# Patient Record
Sex: Male | Born: 1940
Health system: Southern US, Community
[De-identification: ages and names within clinical notes are randomized; demographics above are authoritative.]

## PROBLEM LIST (undated history)

## (undated) DIAGNOSIS — M1990 Unspecified osteoarthritis, unspecified site: Secondary | ICD-10-CM

## (undated) DIAGNOSIS — F329 Major depressive disorder, single episode, unspecified: Secondary | ICD-10-CM

## (undated) DIAGNOSIS — L600 Ingrowing nail: Secondary | ICD-10-CM

## (undated) DIAGNOSIS — E785 Hyperlipidemia, unspecified: Secondary | ICD-10-CM

## (undated) DIAGNOSIS — F32A Depression, unspecified: Secondary | ICD-10-CM

## (undated) HISTORY — PX: HEMORROIDECTOMY: SUR656

## (undated) HISTORY — PX: TONSILLECTOMY: SUR1361

---

## 2007-06-26 ENCOUNTER — Ambulatory Visit: Payer: Self-pay | Admitting: Internal Medicine

## 2007-09-23 DIAGNOSIS — C4491 Basal cell carcinoma of skin, unspecified: Secondary | ICD-10-CM

## 2007-09-23 HISTORY — DX: Basal cell carcinoma of skin, unspecified: C44.91

## 2009-09-30 DIAGNOSIS — D239 Other benign neoplasm of skin, unspecified: Secondary | ICD-10-CM

## 2009-09-30 HISTORY — DX: Other benign neoplasm of skin, unspecified: D23.9

## 2010-07-28 ENCOUNTER — Ambulatory Visit: Payer: Self-pay | Admitting: Unknown Physician Specialty

## 2010-07-29 LAB — PATHOLOGY REPORT

## 2015-01-07 ENCOUNTER — Encounter (INDEPENDENT_AMBULATORY_CARE_PROVIDER_SITE_OTHER): Payer: Medicare PPO | Admitting: Ophthalmology

## 2015-01-07 DIAGNOSIS — H3531 Nonexudative age-related macular degeneration: Secondary | ICD-10-CM

## 2015-01-07 DIAGNOSIS — H35373 Puckering of macula, bilateral: Secondary | ICD-10-CM

## 2015-01-07 DIAGNOSIS — H43813 Vitreous degeneration, bilateral: Secondary | ICD-10-CM

## 2015-07-09 ENCOUNTER — Ambulatory Visit (INDEPENDENT_AMBULATORY_CARE_PROVIDER_SITE_OTHER): Payer: Medicare PPO | Admitting: Ophthalmology

## 2015-07-09 DIAGNOSIS — H43813 Vitreous degeneration, bilateral: Secondary | ICD-10-CM | POA: Diagnosis not present

## 2015-07-09 DIAGNOSIS — H3531 Nonexudative age-related macular degeneration: Secondary | ICD-10-CM

## 2015-07-09 DIAGNOSIS — H35363 Drusen (degenerative) of macula, bilateral: Secondary | ICD-10-CM

## 2015-09-24 ENCOUNTER — Encounter: Payer: Self-pay | Admitting: *Deleted

## 2015-09-27 ENCOUNTER — Ambulatory Visit: Payer: Medicare PPO | Admitting: Certified Registered Nurse Anesthetist

## 2015-09-27 ENCOUNTER — Encounter
Admission: RE | Disposition: A | Discharge status: Home / Self Care | Payer: Self-pay | Source: Ambulatory Visit | Attending: Unknown Physician Specialty

## 2015-09-27 ENCOUNTER — Ambulatory Visit
Admission: RE | Admit: 2015-09-27 | Discharge: 2015-09-27 | Disposition: A | Discharge status: Home / Self Care | Payer: Medicare PPO | Source: Ambulatory Visit | Attending: Unknown Physician Specialty | Admitting: Unknown Physician Specialty

## 2015-09-27 DIAGNOSIS — K640 First degree hemorrhoids: Secondary | ICD-10-CM | POA: Insufficient documentation

## 2015-09-27 DIAGNOSIS — Z79899 Other long term (current) drug therapy: Secondary | ICD-10-CM | POA: Insufficient documentation

## 2015-09-27 DIAGNOSIS — Z86010 Personal history of colonic polyps: Secondary | ICD-10-CM | POA: Insufficient documentation

## 2015-09-27 DIAGNOSIS — K5730 Diverticulosis of large intestine without perforation or abscess without bleeding: Secondary | ICD-10-CM | POA: Diagnosis not present

## 2015-09-27 DIAGNOSIS — E785 Hyperlipidemia, unspecified: Secondary | ICD-10-CM | POA: Diagnosis not present

## 2015-09-27 DIAGNOSIS — M1990 Unspecified osteoarthritis, unspecified site: Secondary | ICD-10-CM | POA: Insufficient documentation

## 2015-09-27 DIAGNOSIS — F329 Major depressive disorder, single episode, unspecified: Secondary | ICD-10-CM | POA: Diagnosis not present

## 2015-09-27 DIAGNOSIS — Z1211 Encounter for screening for malignant neoplasm of colon: Secondary | ICD-10-CM | POA: Diagnosis not present

## 2015-09-27 DIAGNOSIS — Z7982 Long term (current) use of aspirin: Secondary | ICD-10-CM | POA: Diagnosis not present

## 2015-09-27 DIAGNOSIS — Z87891 Personal history of nicotine dependence: Secondary | ICD-10-CM | POA: Diagnosis not present

## 2015-09-27 HISTORY — DX: Ingrowing nail: L60.0

## 2015-09-27 HISTORY — DX: Depression, unspecified: F32.A

## 2015-09-27 HISTORY — DX: Hyperlipidemia, unspecified: E78.5

## 2015-09-27 HISTORY — DX: Major depressive disorder, single episode, unspecified: F32.9

## 2015-09-27 HISTORY — DX: Unspecified osteoarthritis, unspecified site: M19.90

## 2015-09-27 HISTORY — PX: COLONOSCOPY WITH PROPOFOL: SHX5780

## 2015-09-27 SURGERY — COLONOSCOPY WITH PROPOFOL
Anesthesia: General

## 2015-09-27 MED ORDER — SODIUM CHLORIDE 0.9 % IV SOLN
INTRAVENOUS | Status: DC
  Administered 2015-09-27: 1000 mL via INTRAVENOUS

## 2015-09-27 MED ORDER — PROPOFOL 10 MG/ML IV BOLUS
INTRAVENOUS | Status: DC | PRN
  Administered 2015-09-27: 45 mg via INTRAVENOUS

## 2015-09-27 MED ORDER — LIDOCAINE HCL (CARDIAC) 20 MG/ML IV SOLN
INTRAVENOUS | Status: DC | PRN
  Administered 2015-09-27: 67 mg via INTRAVENOUS

## 2015-09-27 MED ORDER — SODIUM CHLORIDE 0.9 % IV SOLN: INTRAVENOUS | Status: DC

## 2015-09-27 MED ORDER — PROPOFOL 500 MG/50ML IV EMUL
INTRAVENOUS | Status: DC | PRN
  Administered 2015-09-27: 120 ug/kg/min via INTRAVENOUS

## 2015-09-27 MED ORDER — FENTANYL CITRATE (PF) 100 MCG/2ML IJ SOLN
INTRAMUSCULAR | Status: DC | PRN
  Administered 2015-09-27: 50 ug via INTRAVENOUS

## 2015-09-27 NOTE — Transfer of Care (Signed)
Immediate Anesthesia Transfer of Care Note  Patient: Dwayne Banks  Procedure(s) Performed: Procedure(s): COLONOSCOPY WITH PROPOFOL (N/A)  Patient Location: PACU  Anesthesia Type:General  Level of Consciousness: awake and patient cooperative  Airway & Oxygen Therapy: Patient Spontanous Breathing and Patient connected to nasal cannula oxygen  Post-op Assessment: Report given to RN and Post -op Vital signs reviewed and stable  Post vital signs: Reviewed and stable  Last Vitals:  Filed Vitals:   09/27/15 1131  BP: 136/87  Pulse: 73  Temp: 36 C  Resp: 14    Complications: No apparent anesthesia complications

## 2015-09-27 NOTE — H&P (Signed)
   Primary Care Physician:  Idelle Crouch, MD Primary Gastroenterologist:  Dr. Vira Agar  Pre-Procedure History & Physical: HPI:  Dwayne Banks is a 74 y.o. male is here for an colonoscopy.   Past Medical History  Diagnosis Date  . Arthritis   . Depression   . Hyperlipidemia   . Onychocryptosis     Past Surgical History  Procedure Laterality Date  . Tonsillectomy    . Hemorroidectomy      Prior to Admission medications   Medication Sig Start Date End Date Taking? Authorizing Provider  aspirin (ASPIRIN EC) 81 MG EC tablet Take 81 mg by mouth daily. Swallow whole.   Yes Historical Provider, MD  citalopram (CELEXA) 20 MG tablet Take 20 mg by mouth daily.   Yes Historical Provider, MD  GLUCOSAMINE SULFATE PO Take 500 mg by mouth daily.   Yes Historical Provider, MD  simvastatin (ZOCOR) 40 MG tablet Take 40 mg by mouth daily.   Yes Historical Provider, MD    Allergies as of 07/27/2015  . (Not on File)    No family history on file.  Social History   Social History  . Marital Status: Married    Spouse Name: N/A  . Number of Children: N/A  . Years of Education: N/A   Occupational History  . Not on file.   Social History Main Topics  . Smoking status: Former Research scientist (life sciences)  . Smokeless tobacco: Never Used  . Alcohol Use: Not on file  . Drug Use: Not on file  . Sexual Activity: Not on file   Other Topics Concern  . Not on file   Social History Narrative  . No narrative on file    Review of Systems: See HPI, otherwise negative ROS  Physical Exam: BP 151/78 mmHg  Pulse 65  Temp(Src) 97.3 F (36.3 C) (Tympanic)  Resp 16  Ht 5\' 8"  (1.727 m)  Wt 70.308 kg (155 lb)  BMI 23.57 kg/m2  SpO2 100% General:   Alert,  pleasant and cooperative in NAD Head:  Normocephalic and atraumatic. Neck:  Supple; no masses or thyromegaly. Lungs:  Clear throughout to auscultation.    Heart:  Regular rate and rhythm. Abdomen:  Soft, nontender and nondistended. Normal bowel sounds,  without guarding, and without rebound.   Neurologic:  Alert and  oriented x4;  grossly normal neurologically.  Impression/Plan: Dwayne Banks is here for an colonoscopy to be performed for [facility] colon polyps  Risks, benefits, limitations, and alternatives regarding  colonoscopy have been reviewed with the patient.  Questions have been answered.  All parties agreeable.   Gaylyn Cheers, MD  09/27/2015, 11:05 AM

## 2015-09-27 NOTE — Op Note (Signed)
[facility] Gastroenterology Patient Name: Dwayne Banks Procedure Date: 09/27/2015 11:06 AM MRN: [id number] Account #: [id number] Date of Birth: 04-Mar-1941 Admit Type: Outpatient Age: 74 Room: [facility] ENDO ROOM 1 Gender: Male Note Status: Finalized Procedure:         Colonoscopy Indications:       High risk colon cancer surveillance: Personal history of                     colonic polyps Providers:         Manya Silvas, MD Referring MD:      Leonie Douglas. Doy Hutching, MD (Referring MD) Medicines:         Propofol per Anesthesia Complications:     No immediate complications. Procedure:         Pre-Anesthesia Assessment:                    - After reviewing the risks and benefits, the patient was                     deemed in satisfactory condition to undergo the procedure.                    After obtaining informed consent, the colonoscope was                     passed under direct vision. Throughout the procedure, the                     patient's blood pressure, pulse, and oxygen saturations                     were monitored continuously. The Colonoscope was                     introduced through the anus and advanced to the the cecum,                     identified by appendiceal orifice and ileocecal valve. The                     colonoscopy was performed without difficulty. The patient                     tolerated the procedure well. The quality of the bowel                     preparation was good. Findings:      Multiple small and large-mouthed diverticula were found in the sigmoid       colon, in the descending colon, in the transverse colon and in the       ascending colon.      Internal hemorrhoids were found during endoscopy. The hemorrhoids were       small and Grade I (internal hemorrhoids that do not prolapse).      The exam was otherwise without abnormality. Impression:        - Diverticulosis in the sigmoid colon, in the descending        colon, in the transverse colon and in the ascending colon.                    - Internal hemorrhoids.                    -  The examination was otherwise normal.                    - No specimens collected. Recommendation:    - The findings and recommendations were discussed with the                     patient's family. Repeat in 5 years or not at all. Manya Silvas, MD 09/27/2015 11:27:05 AM This report has been signed electronically. Number of Addenda: 0 Note Initiated On: 09/27/2015 11:06 AM Scope Withdrawal Time: 0 hours 8 minutes 20 seconds  Total Procedure Duration: 0 hours 13 minutes 1 second       [facility]

## 2015-09-27 NOTE — Anesthesia Preprocedure Evaluation (Signed)
Anesthesia Evaluation  Patient identified by MRN, date of birth, ID band Patient awake    Reviewed: Allergy & Precautions, NPO status , Patient's Chart, lab work & pertinent test results  Airway Mallampati: II       Dental   Pulmonary former smoker,    Pulmonary exam normal        Cardiovascular Exercise Tolerance: Good  Rhythm:Regular Rate:Normal     Neuro/Psych    GI/Hepatic negative GI ROS, Neg liver ROS,   Endo/Other  negative endocrine ROS  Renal/GU negative Renal ROS     Musculoskeletal   Abdominal Normal abdominal exam  (+)   Peds negative pediatric ROS (+)  Hematology negative hematology ROS (+)   Anesthesia Other Findings   Reproductive/Obstetrics                             Anesthesia Physical Anesthesia Plan  ASA: II  Anesthesia Plan: General   Post-op Pain Management:    Induction: Intravenous  Airway Management Planned: Nasal Cannula  Additional Equipment:   Intra-op Plan:   Post-operative Plan:   Informed Consent: I have reviewed the patients History and Physical, chart, labs and discussed the procedure including the risks, benefits and alternatives for the proposed anesthesia with the patient or authorized representative who has indicated his/her understanding and acceptance.     Plan Discussed with: CRNA  Anesthesia Plan Comments:         Anesthesia Quick Evaluation

## 2015-09-27 NOTE — Anesthesia Postprocedure Evaluation (Signed)
  Anesthesia Post-op Note  Patient: Dwayne Banks  Procedure(s) Performed: Procedure(s): COLONOSCOPY WITH PROPOFOL (N/A)  Anesthesia type:General  Patient location: PACU  Post pain: Pain level controlled  Post assessment: Post-op Vital signs reviewed, Patient's Cardiovascular Status Stable, Respiratory Function Stable, Patent Airway and No signs of Nausea or vomiting  Post vital signs: Reviewed and stable  Last Vitals:  Filed Vitals:   09/27/15 1140  BP: 151/81  Pulse: 62  Temp:   Resp: 12    Level of consciousness: awake, alert  and patient cooperative  Complications: No apparent anesthesia complications

## 2015-09-29 ENCOUNTER — Encounter: Payer: Self-pay | Admitting: Unknown Physician Specialty

## 2016-03-20 DIAGNOSIS — L570 Actinic keratosis: Secondary | ICD-10-CM | POA: Diagnosis not present

## 2016-03-20 DIAGNOSIS — D485 Neoplasm of uncertain behavior of skin: Secondary | ICD-10-CM | POA: Diagnosis not present

## 2016-03-20 DIAGNOSIS — D1800 Hemangioma unspecified site: Secondary | ICD-10-CM | POA: Diagnosis not present

## 2016-03-20 DIAGNOSIS — C44319 Basal cell carcinoma of skin of other parts of face: Secondary | ICD-10-CM | POA: Diagnosis not present

## 2016-03-20 DIAGNOSIS — L578 Other skin changes due to chronic exposure to nonionizing radiation: Secondary | ICD-10-CM | POA: Diagnosis not present

## 2016-03-20 DIAGNOSIS — D229 Melanocytic nevi, unspecified: Secondary | ICD-10-CM | POA: Diagnosis not present

## 2016-03-20 DIAGNOSIS — L821 Other seborrheic keratosis: Secondary | ICD-10-CM | POA: Diagnosis not present

## 2016-03-20 DIAGNOSIS — Z85828 Personal history of other malignant neoplasm of skin: Secondary | ICD-10-CM | POA: Diagnosis not present

## 2016-03-20 DIAGNOSIS — L812 Freckles: Secondary | ICD-10-CM | POA: Diagnosis not present

## 2016-04-03 ENCOUNTER — Ambulatory Visit (INDEPENDENT_AMBULATORY_CARE_PROVIDER_SITE_OTHER): Payer: PPO | Admitting: Ophthalmology

## 2016-04-03 DIAGNOSIS — H353132 Nonexudative age-related macular degeneration, bilateral, intermediate dry stage: Secondary | ICD-10-CM | POA: Diagnosis not present

## 2016-04-03 DIAGNOSIS — H43813 Vitreous degeneration, bilateral: Secondary | ICD-10-CM

## 2016-04-03 DIAGNOSIS — H35372 Puckering of macula, left eye: Secondary | ICD-10-CM | POA: Diagnosis not present

## 2016-04-12 ENCOUNTER — Ambulatory Visit (INDEPENDENT_AMBULATORY_CARE_PROVIDER_SITE_OTHER): Payer: Medicare PPO | Admitting: Ophthalmology

## 2016-04-19 DIAGNOSIS — E7800 Pure hypercholesterolemia, unspecified: Secondary | ICD-10-CM | POA: Diagnosis not present

## 2016-04-19 DIAGNOSIS — Z79899 Other long term (current) drug therapy: Secondary | ICD-10-CM | POA: Diagnosis not present

## 2016-04-19 DIAGNOSIS — Z125 Encounter for screening for malignant neoplasm of prostate: Secondary | ICD-10-CM | POA: Diagnosis not present

## 2016-05-09 DIAGNOSIS — C44319 Basal cell carcinoma of skin of other parts of face: Secondary | ICD-10-CM | POA: Diagnosis not present

## 2016-05-29 DIAGNOSIS — Z1329 Encounter for screening for other suspected endocrine disorder: Secondary | ICD-10-CM | POA: Diagnosis not present

## 2016-05-29 DIAGNOSIS — E7800 Pure hypercholesterolemia, unspecified: Secondary | ICD-10-CM | POA: Diagnosis not present

## 2016-05-29 DIAGNOSIS — E538 Deficiency of other specified B group vitamins: Secondary | ICD-10-CM | POA: Diagnosis not present

## 2016-05-29 DIAGNOSIS — M791 Myalgia: Secondary | ICD-10-CM | POA: Diagnosis not present

## 2016-05-29 DIAGNOSIS — M1990 Unspecified osteoarthritis, unspecified site: Secondary | ICD-10-CM | POA: Diagnosis not present

## 2016-05-29 DIAGNOSIS — R5381 Other malaise: Secondary | ICD-10-CM | POA: Diagnosis not present

## 2016-05-29 DIAGNOSIS — M2550 Pain in unspecified joint: Secondary | ICD-10-CM | POA: Diagnosis not present

## 2016-05-29 DIAGNOSIS — R5383 Other fatigue: Secondary | ICD-10-CM | POA: Diagnosis not present

## 2016-09-28 DIAGNOSIS — Z85828 Personal history of other malignant neoplasm of skin: Secondary | ICD-10-CM | POA: Diagnosis not present

## 2016-09-28 DIAGNOSIS — D1800 Hemangioma unspecified site: Secondary | ICD-10-CM | POA: Diagnosis not present

## 2016-09-28 DIAGNOSIS — L570 Actinic keratosis: Secondary | ICD-10-CM | POA: Diagnosis not present

## 2016-09-28 DIAGNOSIS — L821 Other seborrheic keratosis: Secondary | ICD-10-CM | POA: Diagnosis not present

## 2016-09-28 DIAGNOSIS — L812 Freckles: Secondary | ICD-10-CM | POA: Diagnosis not present

## 2016-09-28 DIAGNOSIS — Z1283 Encounter for screening for malignant neoplasm of skin: Secondary | ICD-10-CM | POA: Diagnosis not present

## 2016-09-28 DIAGNOSIS — L820 Inflamed seborrheic keratosis: Secondary | ICD-10-CM | POA: Diagnosis not present

## 2016-09-28 DIAGNOSIS — L578 Other skin changes due to chronic exposure to nonionizing radiation: Secondary | ICD-10-CM | POA: Diagnosis not present

## 2016-10-16 DIAGNOSIS — Z125 Encounter for screening for malignant neoplasm of prostate: Secondary | ICD-10-CM | POA: Diagnosis not present

## 2016-10-16 DIAGNOSIS — E538 Deficiency of other specified B group vitamins: Secondary | ICD-10-CM | POA: Diagnosis not present

## 2016-10-16 DIAGNOSIS — E7800 Pure hypercholesterolemia, unspecified: Secondary | ICD-10-CM | POA: Diagnosis not present

## 2016-10-16 DIAGNOSIS — F3341 Major depressive disorder, recurrent, in partial remission: Secondary | ICD-10-CM | POA: Diagnosis not present

## 2016-10-16 DIAGNOSIS — Z79899 Other long term (current) drug therapy: Secondary | ICD-10-CM | POA: Diagnosis not present

## 2016-10-16 DIAGNOSIS — Z0000 Encounter for general adult medical examination without abnormal findings: Secondary | ICD-10-CM | POA: Diagnosis not present

## 2016-12-28 ENCOUNTER — Ambulatory Visit (INDEPENDENT_AMBULATORY_CARE_PROVIDER_SITE_OTHER): Payer: PPO | Admitting: Ophthalmology

## 2016-12-28 DIAGNOSIS — H43813 Vitreous degeneration, bilateral: Secondary | ICD-10-CM

## 2016-12-28 DIAGNOSIS — H26493 Other secondary cataract, bilateral: Secondary | ICD-10-CM | POA: Diagnosis not present

## 2016-12-28 DIAGNOSIS — H353111 Nonexudative age-related macular degeneration, right eye, early dry stage: Secondary | ICD-10-CM

## 2016-12-28 DIAGNOSIS — H35372 Puckering of macula, left eye: Secondary | ICD-10-CM | POA: Diagnosis not present

## 2016-12-28 DIAGNOSIS — H353122 Nonexudative age-related macular degeneration, left eye, intermediate dry stage: Secondary | ICD-10-CM

## 2017-02-12 DIAGNOSIS — L570 Actinic keratosis: Secondary | ICD-10-CM | POA: Diagnosis not present

## 2017-02-19 ENCOUNTER — Encounter (INDEPENDENT_AMBULATORY_CARE_PROVIDER_SITE_OTHER): Payer: PPO | Admitting: Ophthalmology

## 2017-02-19 DIAGNOSIS — H2702 Aphakia, left eye: Secondary | ICD-10-CM

## 2017-03-07 DIAGNOSIS — L570 Actinic keratosis: Secondary | ICD-10-CM | POA: Diagnosis not present

## 2017-03-07 DIAGNOSIS — D485 Neoplasm of uncertain behavior of skin: Secondary | ICD-10-CM | POA: Diagnosis not present

## 2017-03-07 DIAGNOSIS — Z1283 Encounter for screening for malignant neoplasm of skin: Secondary | ICD-10-CM | POA: Diagnosis not present

## 2017-03-07 DIAGNOSIS — L812 Freckles: Secondary | ICD-10-CM | POA: Diagnosis not present

## 2017-03-07 DIAGNOSIS — L578 Other skin changes due to chronic exposure to nonionizing radiation: Secondary | ICD-10-CM | POA: Diagnosis not present

## 2017-03-07 DIAGNOSIS — D1800 Hemangioma unspecified site: Secondary | ICD-10-CM | POA: Diagnosis not present

## 2017-03-07 DIAGNOSIS — L821 Other seborrheic keratosis: Secondary | ICD-10-CM | POA: Diagnosis not present

## 2017-03-07 DIAGNOSIS — Z85828 Personal history of other malignant neoplasm of skin: Secondary | ICD-10-CM | POA: Diagnosis not present

## 2017-03-28 DIAGNOSIS — H35373 Puckering of macula, bilateral: Secondary | ICD-10-CM | POA: Diagnosis not present

## 2017-03-28 DIAGNOSIS — H1789 Other corneal scars and opacities: Secondary | ICD-10-CM | POA: Diagnosis not present

## 2017-03-28 DIAGNOSIS — H353131 Nonexudative age-related macular degeneration, bilateral, early dry stage: Secondary | ICD-10-CM | POA: Diagnosis not present

## 2017-05-31 ENCOUNTER — Ambulatory Visit
Admission: RE | Admit: 2017-05-31 | Discharge: 2017-05-31 | Disposition: A | Discharge status: Home / Self Care | Payer: PPO | Source: Ambulatory Visit | Attending: Internal Medicine | Admitting: Internal Medicine

## 2017-05-31 ENCOUNTER — Other Ambulatory Visit: Payer: Self-pay | Admitting: Internal Medicine

## 2017-05-31 ENCOUNTER — Other Ambulatory Visit (HOSPITAL_COMMUNITY): Payer: Self-pay | Admitting: Internal Medicine

## 2017-05-31 DIAGNOSIS — W540XXA Bitten by dog, initial encounter: Secondary | ICD-10-CM | POA: Insufficient documentation

## 2017-05-31 DIAGNOSIS — G319 Degenerative disease of nervous system, unspecified: Secondary | ICD-10-CM | POA: Diagnosis not present

## 2017-05-31 DIAGNOSIS — J3489 Other specified disorders of nose and nasal sinuses: Secondary | ICD-10-CM | POA: Diagnosis not present

## 2017-05-31 DIAGNOSIS — S065X0A Traumatic subdural hemorrhage without loss of consciousness, initial encounter: Secondary | ICD-10-CM | POA: Diagnosis not present

## 2017-05-31 DIAGNOSIS — I708 Atherosclerosis of other arteries: Secondary | ICD-10-CM | POA: Insufficient documentation

## 2017-05-31 DIAGNOSIS — S0990XS Unspecified injury of head, sequela: Secondary | ICD-10-CM

## 2017-05-31 DIAGNOSIS — Z79899 Other long term (current) drug therapy: Secondary | ICD-10-CM | POA: Diagnosis not present

## 2017-05-31 DIAGNOSIS — S0990XA Unspecified injury of head, initial encounter: Secondary | ICD-10-CM | POA: Diagnosis not present

## 2017-06-26 ENCOUNTER — Ambulatory Visit: Payer: PPO

## 2017-06-29 ENCOUNTER — Ambulatory Visit
Admission: RE | Admit: 2017-06-29 | Discharge: 2017-06-29 | Disposition: A | Discharge status: Home / Self Care | Payer: PPO | Source: Ambulatory Visit | Attending: Internal Medicine | Admitting: Internal Medicine

## 2017-06-29 DIAGNOSIS — X58XXXS Exposure to other specified factors, sequela: Secondary | ICD-10-CM | POA: Insufficient documentation

## 2017-06-29 DIAGNOSIS — S065X9S Traumatic subdural hemorrhage with loss of consciousness of unspecified duration, sequela: Secondary | ICD-10-CM | POA: Insufficient documentation

## 2017-06-29 DIAGNOSIS — S0990XS Unspecified injury of head, sequela: Secondary | ICD-10-CM

## 2017-06-29 DIAGNOSIS — I6200 Nontraumatic subdural hemorrhage, unspecified: Secondary | ICD-10-CM | POA: Diagnosis not present

## 2017-09-19 DIAGNOSIS — C4492 Squamous cell carcinoma of skin, unspecified: Secondary | ICD-10-CM

## 2017-09-19 DIAGNOSIS — D485 Neoplasm of uncertain behavior of skin: Secondary | ICD-10-CM | POA: Diagnosis not present

## 2017-09-19 DIAGNOSIS — D1800 Hemangioma unspecified site: Secondary | ICD-10-CM | POA: Diagnosis not present

## 2017-09-19 DIAGNOSIS — C44519 Basal cell carcinoma of skin of other part of trunk: Secondary | ICD-10-CM | POA: Diagnosis not present

## 2017-09-19 DIAGNOSIS — Z85828 Personal history of other malignant neoplasm of skin: Secondary | ICD-10-CM | POA: Diagnosis not present

## 2017-09-19 DIAGNOSIS — L570 Actinic keratosis: Secondary | ICD-10-CM | POA: Diagnosis not present

## 2017-09-19 DIAGNOSIS — D0439 Carcinoma in situ of skin of other parts of face: Secondary | ICD-10-CM | POA: Diagnosis not present

## 2017-09-19 DIAGNOSIS — L578 Other skin changes due to chronic exposure to nonionizing radiation: Secondary | ICD-10-CM | POA: Diagnosis not present

## 2017-09-19 DIAGNOSIS — L821 Other seborrheic keratosis: Secondary | ICD-10-CM | POA: Diagnosis not present

## 2017-09-19 DIAGNOSIS — Z1283 Encounter for screening for malignant neoplasm of skin: Secondary | ICD-10-CM | POA: Diagnosis not present

## 2017-09-19 HISTORY — DX: Squamous cell carcinoma of skin, unspecified: C44.92

## 2017-09-20 DIAGNOSIS — H6123 Impacted cerumen, bilateral: Secondary | ICD-10-CM | POA: Diagnosis not present

## 2017-09-20 DIAGNOSIS — H903 Sensorineural hearing loss, bilateral: Secondary | ICD-10-CM | POA: Diagnosis not present

## 2017-10-17 DIAGNOSIS — E538 Deficiency of other specified B group vitamins: Secondary | ICD-10-CM | POA: Diagnosis not present

## 2017-10-17 DIAGNOSIS — Z125 Encounter for screening for malignant neoplasm of prostate: Secondary | ICD-10-CM | POA: Diagnosis not present

## 2017-10-17 DIAGNOSIS — E7800 Pure hypercholesterolemia, unspecified: Secondary | ICD-10-CM | POA: Diagnosis not present

## 2017-10-17 DIAGNOSIS — F3341 Major depressive disorder, recurrent, in partial remission: Secondary | ICD-10-CM | POA: Diagnosis not present

## 2017-10-17 DIAGNOSIS — Z79899 Other long term (current) drug therapy: Secondary | ICD-10-CM | POA: Diagnosis not present

## 2017-10-17 DIAGNOSIS — Z0000 Encounter for general adult medical examination without abnormal findings: Secondary | ICD-10-CM | POA: Diagnosis not present

## 2017-11-26 ENCOUNTER — Ambulatory Visit (INDEPENDENT_AMBULATORY_CARE_PROVIDER_SITE_OTHER): Payer: PPO | Admitting: Ophthalmology

## 2017-12-04 ENCOUNTER — Encounter (INDEPENDENT_AMBULATORY_CARE_PROVIDER_SITE_OTHER): Payer: PPO | Admitting: Ophthalmology

## 2017-12-04 DIAGNOSIS — H35372 Puckering of macula, left eye: Secondary | ICD-10-CM | POA: Diagnosis not present

## 2017-12-04 DIAGNOSIS — H353111 Nonexudative age-related macular degeneration, right eye, early dry stage: Secondary | ICD-10-CM | POA: Diagnosis not present

## 2017-12-04 DIAGNOSIS — H353122 Nonexudative age-related macular degeneration, left eye, intermediate dry stage: Secondary | ICD-10-CM

## 2017-12-04 DIAGNOSIS — H43813 Vitreous degeneration, bilateral: Secondary | ICD-10-CM

## 2018-03-20 DIAGNOSIS — L812 Freckles: Secondary | ICD-10-CM | POA: Diagnosis not present

## 2018-03-20 DIAGNOSIS — L570 Actinic keratosis: Secondary | ICD-10-CM

## 2018-03-20 DIAGNOSIS — D485 Neoplasm of uncertain behavior of skin: Secondary | ICD-10-CM | POA: Diagnosis not present

## 2018-03-20 DIAGNOSIS — Z1283 Encounter for screening for malignant neoplasm of skin: Secondary | ICD-10-CM | POA: Diagnosis not present

## 2018-03-20 DIAGNOSIS — D1801 Hemangioma of skin and subcutaneous tissue: Secondary | ICD-10-CM | POA: Diagnosis not present

## 2018-03-20 DIAGNOSIS — Z85828 Personal history of other malignant neoplasm of skin: Secondary | ICD-10-CM | POA: Diagnosis not present

## 2018-03-20 DIAGNOSIS — L578 Other skin changes due to chronic exposure to nonionizing radiation: Secondary | ICD-10-CM | POA: Diagnosis not present

## 2018-03-20 HISTORY — DX: Actinic keratosis: L57.0

## 2018-04-16 DIAGNOSIS — Z79899 Other long term (current) drug therapy: Secondary | ICD-10-CM | POA: Diagnosis not present

## 2018-04-16 DIAGNOSIS — R42 Dizziness and giddiness: Secondary | ICD-10-CM | POA: Diagnosis not present

## 2018-04-16 DIAGNOSIS — E538 Deficiency of other specified B group vitamins: Secondary | ICD-10-CM | POA: Diagnosis not present

## 2018-04-16 DIAGNOSIS — E7800 Pure hypercholesterolemia, unspecified: Secondary | ICD-10-CM | POA: Diagnosis not present

## 2018-04-17 DIAGNOSIS — E7800 Pure hypercholesterolemia, unspecified: Secondary | ICD-10-CM | POA: Diagnosis not present

## 2018-04-17 DIAGNOSIS — Z79899 Other long term (current) drug therapy: Secondary | ICD-10-CM | POA: Diagnosis not present

## 2018-04-24 DIAGNOSIS — L570 Actinic keratosis: Secondary | ICD-10-CM | POA: Diagnosis not present

## 2018-04-29 DIAGNOSIS — I6523 Occlusion and stenosis of bilateral carotid arteries: Secondary | ICD-10-CM | POA: Diagnosis not present

## 2018-04-29 DIAGNOSIS — R42 Dizziness and giddiness: Secondary | ICD-10-CM | POA: Diagnosis not present

## 2018-05-09 DIAGNOSIS — L570 Actinic keratosis: Secondary | ICD-10-CM | POA: Diagnosis not present

## 2018-06-24 DIAGNOSIS — L570 Actinic keratosis: Secondary | ICD-10-CM | POA: Diagnosis not present

## 2018-06-27 DIAGNOSIS — R009 Unspecified abnormalities of heart beat: Secondary | ICD-10-CM | POA: Diagnosis not present

## 2018-06-27 DIAGNOSIS — W57XXXA Bitten or stung by nonvenomous insect and other nonvenomous arthropods, initial encounter: Secondary | ICD-10-CM | POA: Diagnosis not present

## 2018-06-27 DIAGNOSIS — Z79899 Other long term (current) drug therapy: Secondary | ICD-10-CM | POA: Diagnosis not present

## 2018-06-27 DIAGNOSIS — R509 Fever, unspecified: Secondary | ICD-10-CM | POA: Diagnosis not present

## 2018-06-27 DIAGNOSIS — R8290 Unspecified abnormal findings in urine: Secondary | ICD-10-CM | POA: Diagnosis not present

## 2018-08-26 DIAGNOSIS — Z85828 Personal history of other malignant neoplasm of skin: Secondary | ICD-10-CM | POA: Diagnosis not present

## 2018-08-26 DIAGNOSIS — Z1283 Encounter for screening for malignant neoplasm of skin: Secondary | ICD-10-CM | POA: Diagnosis not present

## 2018-08-26 DIAGNOSIS — D485 Neoplasm of uncertain behavior of skin: Secondary | ICD-10-CM | POA: Diagnosis not present

## 2018-08-26 DIAGNOSIS — L578 Other skin changes due to chronic exposure to nonionizing radiation: Secondary | ICD-10-CM | POA: Diagnosis not present

## 2018-08-26 DIAGNOSIS — L820 Inflamed seborrheic keratosis: Secondary | ICD-10-CM | POA: Diagnosis not present

## 2018-08-26 DIAGNOSIS — L570 Actinic keratosis: Secondary | ICD-10-CM | POA: Diagnosis not present

## 2018-09-17 ENCOUNTER — Encounter (INDEPENDENT_AMBULATORY_CARE_PROVIDER_SITE_OTHER): Payer: PPO | Admitting: Ophthalmology

## 2018-09-17 DIAGNOSIS — H353111 Nonexudative age-related macular degeneration, right eye, early dry stage: Secondary | ICD-10-CM | POA: Diagnosis not present

## 2018-09-17 DIAGNOSIS — H43813 Vitreous degeneration, bilateral: Secondary | ICD-10-CM | POA: Diagnosis not present

## 2018-09-17 DIAGNOSIS — H353122 Nonexudative age-related macular degeneration, left eye, intermediate dry stage: Secondary | ICD-10-CM | POA: Diagnosis not present

## 2018-09-17 DIAGNOSIS — H35372 Puckering of macula, left eye: Secondary | ICD-10-CM

## 2018-09-24 ENCOUNTER — Encounter (INDEPENDENT_AMBULATORY_CARE_PROVIDER_SITE_OTHER): Payer: PPO | Admitting: Ophthalmology

## 2018-09-24 DIAGNOSIS — H353221 Exudative age-related macular degeneration, left eye, with active choroidal neovascularization: Secondary | ICD-10-CM | POA: Diagnosis not present

## 2018-09-24 DIAGNOSIS — H35371 Puckering of macula, right eye: Secondary | ICD-10-CM

## 2018-09-24 DIAGNOSIS — H43813 Vitreous degeneration, bilateral: Secondary | ICD-10-CM | POA: Diagnosis not present

## 2018-10-09 ENCOUNTER — Encounter (INDEPENDENT_AMBULATORY_CARE_PROVIDER_SITE_OTHER): Payer: PPO | Admitting: Ophthalmology

## 2018-10-21 ENCOUNTER — Encounter (INDEPENDENT_AMBULATORY_CARE_PROVIDER_SITE_OTHER): Payer: PPO | Admitting: Ophthalmology

## 2018-10-21 DIAGNOSIS — H35373 Puckering of macula, bilateral: Secondary | ICD-10-CM | POA: Diagnosis not present

## 2018-10-21 DIAGNOSIS — H353221 Exudative age-related macular degeneration, left eye, with active choroidal neovascularization: Secondary | ICD-10-CM | POA: Diagnosis not present

## 2018-10-21 DIAGNOSIS — H43813 Vitreous degeneration, bilateral: Secondary | ICD-10-CM | POA: Diagnosis not present

## 2018-10-21 DIAGNOSIS — H353112 Nonexudative age-related macular degeneration, right eye, intermediate dry stage: Secondary | ICD-10-CM | POA: Diagnosis not present

## 2018-10-23 DIAGNOSIS — E538 Deficiency of other specified B group vitamins: Secondary | ICD-10-CM | POA: Diagnosis not present

## 2018-10-23 DIAGNOSIS — E7800 Pure hypercholesterolemia, unspecified: Secondary | ICD-10-CM | POA: Diagnosis not present

## 2018-10-23 DIAGNOSIS — F3341 Major depressive disorder, recurrent, in partial remission: Secondary | ICD-10-CM | POA: Diagnosis not present

## 2018-10-23 DIAGNOSIS — Z125 Encounter for screening for malignant neoplasm of prostate: Secondary | ICD-10-CM | POA: Diagnosis not present

## 2018-10-23 DIAGNOSIS — R7309 Other abnormal glucose: Secondary | ICD-10-CM | POA: Diagnosis not present

## 2018-10-23 DIAGNOSIS — Z0000 Encounter for general adult medical examination without abnormal findings: Secondary | ICD-10-CM | POA: Diagnosis not present

## 2018-10-23 DIAGNOSIS — Z79899 Other long term (current) drug therapy: Secondary | ICD-10-CM | POA: Diagnosis not present

## 2018-11-20 ENCOUNTER — Encounter (INDEPENDENT_AMBULATORY_CARE_PROVIDER_SITE_OTHER): Payer: PPO | Admitting: Ophthalmology

## 2018-11-20 DIAGNOSIS — H353221 Exudative age-related macular degeneration, left eye, with active choroidal neovascularization: Secondary | ICD-10-CM | POA: Diagnosis not present

## 2018-11-20 DIAGNOSIS — H35373 Puckering of macula, bilateral: Secondary | ICD-10-CM | POA: Diagnosis not present

## 2018-11-20 DIAGNOSIS — H43813 Vitreous degeneration, bilateral: Secondary | ICD-10-CM

## 2018-11-25 DIAGNOSIS — L570 Actinic keratosis: Secondary | ICD-10-CM | POA: Diagnosis not present

## 2018-12-30 ENCOUNTER — Encounter (INDEPENDENT_AMBULATORY_CARE_PROVIDER_SITE_OTHER): Payer: PPO | Admitting: Ophthalmology

## 2018-12-30 DIAGNOSIS — H35371 Puckering of macula, right eye: Secondary | ICD-10-CM

## 2018-12-30 DIAGNOSIS — H43813 Vitreous degeneration, bilateral: Secondary | ICD-10-CM

## 2018-12-30 DIAGNOSIS — H353221 Exudative age-related macular degeneration, left eye, with active choroidal neovascularization: Secondary | ICD-10-CM

## 2018-12-30 DIAGNOSIS — H353112 Nonexudative age-related macular degeneration, right eye, intermediate dry stage: Secondary | ICD-10-CM | POA: Diagnosis not present

## 2019-01-01 DIAGNOSIS — L578 Other skin changes due to chronic exposure to nonionizing radiation: Secondary | ICD-10-CM | POA: Diagnosis not present

## 2019-01-01 DIAGNOSIS — Z85828 Personal history of other malignant neoplasm of skin: Secondary | ICD-10-CM | POA: Diagnosis not present

## 2019-01-01 DIAGNOSIS — L570 Actinic keratosis: Secondary | ICD-10-CM | POA: Diagnosis not present

## 2019-01-01 DIAGNOSIS — L820 Inflamed seborrheic keratosis: Secondary | ICD-10-CM | POA: Diagnosis not present

## 2019-01-01 DIAGNOSIS — D485 Neoplasm of uncertain behavior of skin: Secondary | ICD-10-CM | POA: Diagnosis not present

## 2019-02-03 ENCOUNTER — Encounter (INDEPENDENT_AMBULATORY_CARE_PROVIDER_SITE_OTHER): Payer: PPO | Admitting: Ophthalmology

## 2019-02-03 ENCOUNTER — Other Ambulatory Visit: Payer: Self-pay

## 2019-02-03 DIAGNOSIS — H43813 Vitreous degeneration, bilateral: Secondary | ICD-10-CM

## 2019-02-03 DIAGNOSIS — H353221 Exudative age-related macular degeneration, left eye, with active choroidal neovascularization: Secondary | ICD-10-CM | POA: Diagnosis not present

## 2019-02-03 DIAGNOSIS — H353112 Nonexudative age-related macular degeneration, right eye, intermediate dry stage: Secondary | ICD-10-CM | POA: Diagnosis not present

## 2019-02-12 DIAGNOSIS — R079 Chest pain, unspecified: Secondary | ICD-10-CM | POA: Diagnosis not present

## 2019-02-12 DIAGNOSIS — C50411 Malignant neoplasm of upper-outer quadrant of right female breast: Secondary | ICD-10-CM | POA: Diagnosis not present

## 2019-02-12 DIAGNOSIS — R52 Pain, unspecified: Secondary | ICD-10-CM | POA: Diagnosis not present

## 2019-02-12 DIAGNOSIS — Z170 Estrogen receptor positive status [ER+]: Secondary | ICD-10-CM | POA: Diagnosis not present

## 2019-02-12 DIAGNOSIS — D485 Neoplasm of uncertain behavior of skin: Secondary | ICD-10-CM | POA: Diagnosis not present

## 2019-02-12 DIAGNOSIS — E782 Mixed hyperlipidemia: Secondary | ICD-10-CM | POA: Diagnosis not present

## 2019-02-12 DIAGNOSIS — L578 Other skin changes due to chronic exposure to nonionizing radiation: Secondary | ICD-10-CM | POA: Diagnosis not present

## 2019-02-12 DIAGNOSIS — M542 Cervicalgia: Secondary | ICD-10-CM | POA: Diagnosis not present

## 2019-02-12 DIAGNOSIS — I10 Essential (primary) hypertension: Secondary | ICD-10-CM | POA: Diagnosis not present

## 2019-02-12 DIAGNOSIS — L03313 Cellulitis of chest wall: Secondary | ICD-10-CM | POA: Diagnosis not present

## 2019-02-12 DIAGNOSIS — I454 Nonspecific intraventricular block: Secondary | ICD-10-CM | POA: Diagnosis not present

## 2019-02-12 DIAGNOSIS — Z79899 Other long term (current) drug therapy: Secondary | ICD-10-CM | POA: Diagnosis not present

## 2019-02-12 DIAGNOSIS — I447 Left bundle-branch block, unspecified: Secondary | ICD-10-CM | POA: Diagnosis not present

## 2019-02-12 DIAGNOSIS — C44519 Basal cell carcinoma of skin of other part of trunk: Secondary | ICD-10-CM | POA: Diagnosis not present

## 2019-02-12 DIAGNOSIS — R0689 Other abnormalities of breathing: Secondary | ICD-10-CM | POA: Diagnosis not present

## 2019-02-12 DIAGNOSIS — I2510 Atherosclerotic heart disease of native coronary artery without angina pectoris: Secondary | ICD-10-CM | POA: Diagnosis not present

## 2019-02-13 ENCOUNTER — Encounter (INDEPENDENT_AMBULATORY_CARE_PROVIDER_SITE_OTHER): Payer: PPO | Admitting: Ophthalmology

## 2019-03-03 ENCOUNTER — Encounter (INDEPENDENT_AMBULATORY_CARE_PROVIDER_SITE_OTHER): Payer: PPO | Admitting: Ophthalmology

## 2019-03-03 ENCOUNTER — Other Ambulatory Visit: Payer: Self-pay

## 2019-03-03 DIAGNOSIS — H353112 Nonexudative age-related macular degeneration, right eye, intermediate dry stage: Secondary | ICD-10-CM

## 2019-03-03 DIAGNOSIS — H353221 Exudative age-related macular degeneration, left eye, with active choroidal neovascularization: Secondary | ICD-10-CM

## 2019-03-03 DIAGNOSIS — H43813 Vitreous degeneration, bilateral: Secondary | ICD-10-CM | POA: Diagnosis not present

## 2019-04-08 ENCOUNTER — Encounter (INDEPENDENT_AMBULATORY_CARE_PROVIDER_SITE_OTHER): Payer: PPO | Admitting: Ophthalmology

## 2019-04-08 ENCOUNTER — Other Ambulatory Visit: Payer: Self-pay

## 2019-04-08 DIAGNOSIS — H43813 Vitreous degeneration, bilateral: Secondary | ICD-10-CM | POA: Diagnosis not present

## 2019-04-08 DIAGNOSIS — H35373 Puckering of macula, bilateral: Secondary | ICD-10-CM

## 2019-04-08 DIAGNOSIS — H353132 Nonexudative age-related macular degeneration, bilateral, intermediate dry stage: Secondary | ICD-10-CM | POA: Diagnosis not present

## 2019-04-08 DIAGNOSIS — H353221 Exudative age-related macular degeneration, left eye, with active choroidal neovascularization: Secondary | ICD-10-CM

## 2019-04-16 DIAGNOSIS — Z79899 Other long term (current) drug therapy: Secondary | ICD-10-CM | POA: Diagnosis not present

## 2019-04-16 DIAGNOSIS — E7800 Pure hypercholesterolemia, unspecified: Secondary | ICD-10-CM | POA: Diagnosis not present

## 2019-04-16 DIAGNOSIS — F3341 Major depressive disorder, recurrent, in partial remission: Secondary | ICD-10-CM | POA: Diagnosis not present

## 2019-04-29 DIAGNOSIS — D229 Melanocytic nevi, unspecified: Secondary | ICD-10-CM | POA: Diagnosis not present

## 2019-04-29 DIAGNOSIS — L821 Other seborrheic keratosis: Secondary | ICD-10-CM | POA: Diagnosis not present

## 2019-04-29 DIAGNOSIS — L814 Other melanin hyperpigmentation: Secondary | ICD-10-CM | POA: Diagnosis not present

## 2019-04-29 DIAGNOSIS — C44519 Basal cell carcinoma of skin of other part of trunk: Secondary | ICD-10-CM | POA: Diagnosis not present

## 2019-04-29 DIAGNOSIS — L570 Actinic keratosis: Secondary | ICD-10-CM | POA: Diagnosis not present

## 2019-04-29 DIAGNOSIS — L578 Other skin changes due to chronic exposure to nonionizing radiation: Secondary | ICD-10-CM | POA: Diagnosis not present

## 2019-04-29 DIAGNOSIS — D2230 Melanocytic nevi of unspecified part of face: Secondary | ICD-10-CM | POA: Diagnosis not present

## 2019-04-29 DIAGNOSIS — D225 Melanocytic nevi of trunk: Secondary | ICD-10-CM | POA: Diagnosis not present

## 2019-04-29 DIAGNOSIS — Z1283 Encounter for screening for malignant neoplasm of skin: Secondary | ICD-10-CM | POA: Diagnosis not present

## 2019-04-29 DIAGNOSIS — D1800 Hemangioma unspecified site: Secondary | ICD-10-CM | POA: Diagnosis not present

## 2019-04-29 DIAGNOSIS — L820 Inflamed seborrheic keratosis: Secondary | ICD-10-CM | POA: Diagnosis not present

## 2019-04-29 DIAGNOSIS — Z85828 Personal history of other malignant neoplasm of skin: Secondary | ICD-10-CM | POA: Diagnosis not present

## 2019-05-05 ENCOUNTER — Encounter (INDEPENDENT_AMBULATORY_CARE_PROVIDER_SITE_OTHER): Payer: PPO | Admitting: Ophthalmology

## 2019-05-06 DIAGNOSIS — H35373 Puckering of macula, bilateral: Secondary | ICD-10-CM | POA: Diagnosis not present

## 2019-05-06 DIAGNOSIS — H43813 Vitreous degeneration, bilateral: Secondary | ICD-10-CM | POA: Diagnosis not present

## 2019-05-06 DIAGNOSIS — H43393 Other vitreous opacities, bilateral: Secondary | ICD-10-CM | POA: Diagnosis not present

## 2019-05-14 ENCOUNTER — Encounter (INDEPENDENT_AMBULATORY_CARE_PROVIDER_SITE_OTHER): Payer: PPO | Admitting: Ophthalmology

## 2019-06-20 DIAGNOSIS — H43813 Vitreous degeneration, bilateral: Secondary | ICD-10-CM | POA: Diagnosis not present

## 2019-06-20 DIAGNOSIS — H35373 Puckering of macula, bilateral: Secondary | ICD-10-CM | POA: Diagnosis not present

## 2019-06-20 DIAGNOSIS — H43393 Other vitreous opacities, bilateral: Secondary | ICD-10-CM | POA: Diagnosis not present

## 2019-08-15 DIAGNOSIS — H43393 Other vitreous opacities, bilateral: Secondary | ICD-10-CM | POA: Diagnosis not present

## 2019-08-15 DIAGNOSIS — H353111 Nonexudative age-related macular degeneration, right eye, early dry stage: Secondary | ICD-10-CM | POA: Diagnosis not present

## 2019-08-15 DIAGNOSIS — H43813 Vitreous degeneration, bilateral: Secondary | ICD-10-CM | POA: Diagnosis not present

## 2019-08-15 DIAGNOSIS — H35373 Puckering of macula, bilateral: Secondary | ICD-10-CM | POA: Diagnosis not present

## 2019-08-19 DIAGNOSIS — Z86018 Personal history of other benign neoplasm: Secondary | ICD-10-CM | POA: Diagnosis not present

## 2019-08-19 DIAGNOSIS — L821 Other seborrheic keratosis: Secondary | ICD-10-CM | POA: Diagnosis not present

## 2019-08-19 DIAGNOSIS — Z85828 Personal history of other malignant neoplasm of skin: Secondary | ICD-10-CM | POA: Diagnosis not present

## 2019-08-19 DIAGNOSIS — L570 Actinic keratosis: Secondary | ICD-10-CM | POA: Diagnosis not present

## 2019-08-19 DIAGNOSIS — L820 Inflamed seborrheic keratosis: Secondary | ICD-10-CM | POA: Diagnosis not present

## 2019-08-19 DIAGNOSIS — L578 Other skin changes due to chronic exposure to nonionizing radiation: Secondary | ICD-10-CM | POA: Diagnosis not present

## 2019-10-28 DIAGNOSIS — Z79899 Other long term (current) drug therapy: Secondary | ICD-10-CM | POA: Diagnosis not present

## 2019-10-28 DIAGNOSIS — E538 Deficiency of other specified B group vitamins: Secondary | ICD-10-CM | POA: Diagnosis not present

## 2019-10-28 DIAGNOSIS — Z0000 Encounter for general adult medical examination without abnormal findings: Secondary | ICD-10-CM | POA: Diagnosis not present

## 2019-10-28 DIAGNOSIS — E7800 Pure hypercholesterolemia, unspecified: Secondary | ICD-10-CM | POA: Diagnosis not present

## 2019-10-28 DIAGNOSIS — Z125 Encounter for screening for malignant neoplasm of prostate: Secondary | ICD-10-CM | POA: Diagnosis not present

## 2019-10-28 DIAGNOSIS — F3341 Major depressive disorder, recurrent, in partial remission: Secondary | ICD-10-CM | POA: Diagnosis not present

## 2019-11-19 DIAGNOSIS — Z85828 Personal history of other malignant neoplasm of skin: Secondary | ICD-10-CM | POA: Diagnosis not present

## 2019-11-19 DIAGNOSIS — L570 Actinic keratosis: Secondary | ICD-10-CM | POA: Diagnosis not present

## 2019-11-19 DIAGNOSIS — L578 Other skin changes due to chronic exposure to nonionizing radiation: Secondary | ICD-10-CM | POA: Diagnosis not present

## 2019-12-19 DIAGNOSIS — H353111 Nonexudative age-related macular degeneration, right eye, early dry stage: Secondary | ICD-10-CM | POA: Diagnosis not present

## 2019-12-19 DIAGNOSIS — H353222 Exudative age-related macular degeneration, left eye, with inactive choroidal neovascularization: Secondary | ICD-10-CM | POA: Diagnosis not present

## 2019-12-19 DIAGNOSIS — H43813 Vitreous degeneration, bilateral: Secondary | ICD-10-CM | POA: Diagnosis not present

## 2019-12-19 DIAGNOSIS — H35373 Puckering of macula, bilateral: Secondary | ICD-10-CM | POA: Diagnosis not present

## 2020-01-14 DIAGNOSIS — Z20822 Contact with and (suspected) exposure to covid-19: Secondary | ICD-10-CM | POA: Diagnosis not present

## 2020-01-16 DIAGNOSIS — H353222 Exudative age-related macular degeneration, left eye, with inactive choroidal neovascularization: Secondary | ICD-10-CM | POA: Diagnosis not present

## 2020-01-16 DIAGNOSIS — H353111 Nonexudative age-related macular degeneration, right eye, early dry stage: Secondary | ICD-10-CM | POA: Diagnosis not present

## 2020-02-17 DIAGNOSIS — H353111 Nonexudative age-related macular degeneration, right eye, early dry stage: Secondary | ICD-10-CM | POA: Diagnosis not present

## 2020-02-17 DIAGNOSIS — H35373 Puckering of macula, bilateral: Secondary | ICD-10-CM | POA: Diagnosis not present

## 2020-02-17 DIAGNOSIS — H43813 Vitreous degeneration, bilateral: Secondary | ICD-10-CM | POA: Diagnosis not present

## 2020-02-17 DIAGNOSIS — H353222 Exudative age-related macular degeneration, left eye, with inactive choroidal neovascularization: Secondary | ICD-10-CM | POA: Diagnosis not present

## 2020-04-21 ENCOUNTER — Ambulatory Visit: Payer: PPO

## 2020-04-21 ENCOUNTER — Other Ambulatory Visit: Payer: Self-pay

## 2020-04-21 DIAGNOSIS — L570 Actinic keratosis: Secondary | ICD-10-CM

## 2020-04-21 MED ORDER — AMINOLEVULINIC ACID HCL 20 % EX SOLR
1.0000 "application " | Freq: Once | CUTANEOUS | Status: AC
  Administered 2020-04-21: 354 mg via TOPICAL

## 2020-04-21 NOTE — Progress Notes (Signed)
Patient completed PDT therapy today.  AK (actinic keratosis) Head - Anterior (Face)  Photodynamic therapy - Head - Anterior (Face) Procedure discussed: discussed risks, benefits, side effects. and alternatives   Prep: site scrubbed/prepped with acetone   Location:  Face Number of lesions:  Multiple Type of treatment:  Blue light Aminolevulinic Acid (see MAR for details): Levulan Number of minutes under lamp:  16 Number of seconds under lamp:  40 Cooling:  Floor fan Outcome: patient tolerated procedure well with no complications   Post-procedure details: sunscreen applied    Aminolevulinic Acid HCl 20 % SOLR 354 mg - Head - Anterior (Face)

## 2020-04-21 NOTE — Patient Instructions (Signed)

## 2020-04-27 DIAGNOSIS — E538 Deficiency of other specified B group vitamins: Secondary | ICD-10-CM | POA: Diagnosis not present

## 2020-04-27 DIAGNOSIS — Z79899 Other long term (current) drug therapy: Secondary | ICD-10-CM | POA: Diagnosis not present

## 2020-04-27 DIAGNOSIS — E7800 Pure hypercholesterolemia, unspecified: Secondary | ICD-10-CM | POA: Diagnosis not present

## 2020-04-27 DIAGNOSIS — Z1211 Encounter for screening for malignant neoplasm of colon: Secondary | ICD-10-CM | POA: Diagnosis not present

## 2020-04-27 DIAGNOSIS — F3341 Major depressive disorder, recurrent, in partial remission: Secondary | ICD-10-CM | POA: Diagnosis not present

## 2020-04-29 DIAGNOSIS — Z1211 Encounter for screening for malignant neoplasm of colon: Secondary | ICD-10-CM | POA: Diagnosis not present

## 2020-05-18 DIAGNOSIS — H35373 Puckering of macula, bilateral: Secondary | ICD-10-CM | POA: Diagnosis not present

## 2020-05-18 DIAGNOSIS — H353221 Exudative age-related macular degeneration, left eye, with active choroidal neovascularization: Secondary | ICD-10-CM | POA: Diagnosis not present

## 2020-05-18 DIAGNOSIS — H43813 Vitreous degeneration, bilateral: Secondary | ICD-10-CM | POA: Diagnosis not present

## 2020-05-18 DIAGNOSIS — H353111 Nonexudative age-related macular degeneration, right eye, early dry stage: Secondary | ICD-10-CM | POA: Diagnosis not present

## 2020-05-21 DIAGNOSIS — H353221 Exudative age-related macular degeneration, left eye, with active choroidal neovascularization: Secondary | ICD-10-CM | POA: Diagnosis not present

## 2020-06-12 DIAGNOSIS — S0990XA Unspecified injury of head, initial encounter: Secondary | ICD-10-CM | POA: Diagnosis not present

## 2020-06-12 DIAGNOSIS — F10129 Alcohol abuse with intoxication, unspecified: Secondary | ICD-10-CM | POA: Diagnosis not present

## 2020-06-12 DIAGNOSIS — S0003XA Contusion of scalp, initial encounter: Secondary | ICD-10-CM | POA: Diagnosis not present

## 2020-06-12 DIAGNOSIS — Z7982 Long term (current) use of aspirin: Secondary | ICD-10-CM | POA: Diagnosis not present

## 2020-06-12 DIAGNOSIS — S199XXA Unspecified injury of neck, initial encounter: Secondary | ICD-10-CM | POA: Diagnosis not present

## 2020-06-12 DIAGNOSIS — S065X0A Traumatic subdural hemorrhage without loss of consciousness, initial encounter: Secondary | ICD-10-CM | POA: Diagnosis not present

## 2020-06-12 DIAGNOSIS — S0181XA Laceration without foreign body of other part of head, initial encounter: Secondary | ICD-10-CM | POA: Diagnosis not present

## 2020-06-19 DIAGNOSIS — Z4802 Encounter for removal of sutures: Secondary | ICD-10-CM | POA: Diagnosis not present

## 2020-06-25 ENCOUNTER — Other Ambulatory Visit: Payer: Self-pay | Admitting: Internal Medicine

## 2020-06-25 DIAGNOSIS — S065X9A Traumatic subdural hemorrhage with loss of consciousness of unspecified duration, initial encounter: Secondary | ICD-10-CM | POA: Diagnosis not present

## 2020-06-25 DIAGNOSIS — F3341 Major depressive disorder, recurrent, in partial remission: Secondary | ICD-10-CM | POA: Diagnosis not present

## 2020-06-25 DIAGNOSIS — F1010 Alcohol abuse, uncomplicated: Secondary | ICD-10-CM | POA: Diagnosis not present

## 2020-06-25 DIAGNOSIS — S065XAA Traumatic subdural hemorrhage with loss of consciousness status unknown, initial encounter: Secondary | ICD-10-CM

## 2020-06-25 DIAGNOSIS — Z79899 Other long term (current) drug therapy: Secondary | ICD-10-CM | POA: Diagnosis not present

## 2020-06-29 ENCOUNTER — Ambulatory Visit
Admission: RE | Admit: 2020-06-29 | Discharge: 2020-06-29 | Disposition: A | Discharge status: Home / Self Care | Payer: PPO | Source: Ambulatory Visit | Attending: Internal Medicine | Admitting: Internal Medicine

## 2020-06-29 ENCOUNTER — Other Ambulatory Visit: Payer: Self-pay

## 2020-06-29 DIAGNOSIS — S065XAA Traumatic subdural hemorrhage with loss of consciousness status unknown, initial encounter: Secondary | ICD-10-CM

## 2020-06-29 DIAGNOSIS — S065X9A Traumatic subdural hemorrhage with loss of consciousness of unspecified duration, initial encounter: Secondary | ICD-10-CM | POA: Insufficient documentation

## 2020-06-29 DIAGNOSIS — S065X0A Traumatic subdural hemorrhage without loss of consciousness, initial encounter: Secondary | ICD-10-CM | POA: Diagnosis not present

## 2020-06-29 DIAGNOSIS — I6203 Nontraumatic chronic subdural hemorrhage: Secondary | ICD-10-CM | POA: Diagnosis not present

## 2020-06-29 DIAGNOSIS — I6201 Nontraumatic acute subdural hemorrhage: Secondary | ICD-10-CM | POA: Diagnosis not present

## 2020-06-29 DIAGNOSIS — I6782 Cerebral ischemia: Secondary | ICD-10-CM | POA: Diagnosis not present

## 2020-06-30 ENCOUNTER — Ambulatory Visit: Payer: PPO | Admitting: Dermatology

## 2020-06-30 ENCOUNTER — Other Ambulatory Visit: Payer: Self-pay

## 2020-06-30 DIAGNOSIS — C44319 Basal cell carcinoma of skin of other parts of face: Secondary | ICD-10-CM | POA: Diagnosis not present

## 2020-06-30 DIAGNOSIS — L570 Actinic keratosis: Secondary | ICD-10-CM | POA: Diagnosis not present

## 2020-06-30 DIAGNOSIS — L578 Other skin changes due to chronic exposure to nonionizing radiation: Secondary | ICD-10-CM

## 2020-06-30 DIAGNOSIS — L820 Inflamed seborrheic keratosis: Secondary | ICD-10-CM

## 2020-06-30 DIAGNOSIS — D485 Neoplasm of uncertain behavior of skin: Secondary | ICD-10-CM

## 2020-06-30 NOTE — Progress Notes (Signed)
Follow-Up Visit   Subjective  Dwayne Banks is a 79 y.o. male who presents for the following: Other (left cheek - wife wants it checked. Also has spots on left shoulder and chest that he would like looked at) The patient presents for Upper Body Skin Exam (UBSE) for skin cancer screening and mole check.  The following portions of the chart were reviewed this encounter and updated as appropriate:  Tobacco  Allergies  Meds  Problems  Med Hx  Surg Hx  Fam Hx     Review of Systems:  No other skin or systemic complaints except as noted in HPI or Assessment and Plan.  Objective  Well appearing patient in no apparent distress; mood and affect are within normal limits.  A focused examination was performed including extremities, including the arms, hands, fingers, and fingernails and the legs, feet, toes, and toenails and face, chest. Relevant physical exam findings are noted in the Assessment and Plan.  Objective  Left cheek: 1.6 cm Pearly flat plaque     Objective  Face/trunk (18): Erythematous thin papules/macules with gritty scale.   Objective  Chest (3): Erythematous keratotic or waxy stuck-on papule or plaque.    Assessment & Plan  Neoplasm of uncertain behavior of skin Left cheek  Epidermal / dermal shaving  Lesion diameter (cm):  1.6 Informed consent: discussed and consent obtained   Timeout: patient name, date of birth, surgical site, and procedure verified   Procedure prep:  Patient was prepped and draped in usual sterile fashion Prep type:  Isopropyl alcohol Anesthesia: the lesion was anesthetized in a standard fashion   Anesthetic:  1% lidocaine w/ epinephrine 1-100,000 buffered w/ 8.4% NaHCO3 Instrument used: flexible razor blade   Hemostasis achieved with: pressure, aluminum chloride and electrodesiccation   Outcome: patient tolerated procedure well   Post-procedure details: sterile dressing applied and wound care instructions given   Dressing type:  bandage and petrolatum    Destruction of lesion Complexity: extensive   Destruction method: electrodesiccation and curettage   Informed consent: discussed and consent obtained   Timeout:  patient name, date of birth, surgical site, and procedure verified Procedure prep:  Patient was prepped and draped in usual sterile fashion Prep type:  Isopropyl alcohol Anesthesia: the lesion was anesthetized in a standard fashion   Anesthetic:  1% lidocaine w/ epinephrine 1-100,000 buffered w/ 8.4% NaHCO3 Curettage performed in three different directions: Yes   Electrodesiccation performed over the curetted area: Yes   Lesion length (cm):  1.6 Lesion width (cm):  1.6 Margin per side (cm):  0.2 Final wound size (cm):  2 Hemostasis achieved with:  pressure and aluminum chloride Outcome: patient tolerated procedure well with no complications   Post-procedure details: sterile dressing applied and wound care instructions given   Dressing type: bandage and petrolatum    Specimen 1 - Surgical pathology Differential Diagnosis: BCC vs other Check Margins: No 1.6 cm Pearly flat plaque EDC today  AK (actinic keratosis) (18) Face/trunk  Destruction of lesion - Face/trunk Complexity: simple   Destruction method: cryotherapy   Informed consent: discussed and consent obtained   Timeout:  patient name, date of birth, surgical site, and procedure verified Lesion destroyed using liquid nitrogen: Yes   Region frozen until ice ball extended beyond lesion: Yes   Outcome: patient tolerated procedure well with no complications   Post-procedure details: wound care instructions given    Inflamed seborrheic keratosis (3) Chest  Destruction of lesion - Chest Complexity: simple  Destruction method: cryotherapy   Informed consent: discussed and consent obtained   Timeout:  patient name, date of birth, surgical site, and procedure verified Lesion destroyed using liquid nitrogen: Yes   Region frozen until ice  ball extended beyond lesion: Yes   Outcome: patient tolerated procedure well with no complications   Post-procedure details: wound care instructions given    Actinic Damage - diffuse scaly erythematous macules with underlying dyspigmentation - Recommend daily broad spectrum sunscreen SPF 30+ to sun-exposed areas, reapply every 2 hours as needed.  - Call for new or changing lesions.  Return in about 6 months (around 12/31/2020).  I, Ashok Cordia, CMA, am acting as scribe for Sarina Ser, MD .  Documentation: I have reviewed the above documentation for accuracy and completeness, and I agree with the above.  Sarina Ser, MD

## 2020-06-30 NOTE — Patient Instructions (Signed)

## 2020-07-05 ENCOUNTER — Telehealth: Payer: Self-pay

## 2020-07-05 NOTE — Telephone Encounter (Signed)
Advised patient's wife of results/hd 

## 2020-07-05 NOTE — Telephone Encounter (Signed)
-----   Message from Ralene Bathe, MD sent at 07/05/2020 10:13 AM EDT ----- Skin , left cheek BASAL CELL CARCINOMA, NODULAR PATTERN  Cancer - BCC Already treated Recheck next visit

## 2020-07-06 ENCOUNTER — Encounter: Payer: Self-pay | Admitting: Dermatology

## 2020-07-07 DIAGNOSIS — H353111 Nonexudative age-related macular degeneration, right eye, early dry stage: Secondary | ICD-10-CM | POA: Diagnosis not present

## 2020-07-07 DIAGNOSIS — H43813 Vitreous degeneration, bilateral: Secondary | ICD-10-CM | POA: Diagnosis not present

## 2020-07-07 DIAGNOSIS — H353221 Exudative age-related macular degeneration, left eye, with active choroidal neovascularization: Secondary | ICD-10-CM | POA: Diagnosis not present

## 2020-07-07 DIAGNOSIS — H35373 Puckering of macula, bilateral: Secondary | ICD-10-CM | POA: Diagnosis not present

## 2020-08-19 DIAGNOSIS — Z0000 Encounter for general adult medical examination without abnormal findings: Secondary | ICD-10-CM | POA: Diagnosis not present

## 2020-08-19 DIAGNOSIS — E7800 Pure hypercholesterolemia, unspecified: Secondary | ICD-10-CM | POA: Diagnosis not present

## 2020-08-19 DIAGNOSIS — F3341 Major depressive disorder, recurrent, in partial remission: Secondary | ICD-10-CM | POA: Diagnosis not present

## 2020-08-19 DIAGNOSIS — E538 Deficiency of other specified B group vitamins: Secondary | ICD-10-CM | POA: Diagnosis not present

## 2020-09-01 DIAGNOSIS — H35373 Puckering of macula, bilateral: Secondary | ICD-10-CM | POA: Diagnosis not present

## 2020-09-01 DIAGNOSIS — H43813 Vitreous degeneration, bilateral: Secondary | ICD-10-CM | POA: Diagnosis not present

## 2020-09-01 DIAGNOSIS — H353221 Exudative age-related macular degeneration, left eye, with active choroidal neovascularization: Secondary | ICD-10-CM | POA: Diagnosis not present

## 2020-09-01 DIAGNOSIS — H43393 Other vitreous opacities, bilateral: Secondary | ICD-10-CM | POA: Diagnosis not present

## 2020-10-22 DIAGNOSIS — H353221 Exudative age-related macular degeneration, left eye, with active choroidal neovascularization: Secondary | ICD-10-CM | POA: Diagnosis not present

## 2020-10-22 DIAGNOSIS — H43393 Other vitreous opacities, bilateral: Secondary | ICD-10-CM | POA: Diagnosis not present

## 2020-10-22 DIAGNOSIS — H43813 Vitreous degeneration, bilateral: Secondary | ICD-10-CM | POA: Diagnosis not present

## 2020-10-22 DIAGNOSIS — H35373 Puckering of macula, bilateral: Secondary | ICD-10-CM | POA: Diagnosis not present

## 2020-10-28 DIAGNOSIS — E538 Deficiency of other specified B group vitamins: Secondary | ICD-10-CM | POA: Diagnosis not present

## 2020-10-28 DIAGNOSIS — Z125 Encounter for screening for malignant neoplasm of prostate: Secondary | ICD-10-CM | POA: Diagnosis not present

## 2020-10-28 DIAGNOSIS — Z79899 Other long term (current) drug therapy: Secondary | ICD-10-CM | POA: Diagnosis not present

## 2020-10-28 DIAGNOSIS — R7309 Other abnormal glucose: Secondary | ICD-10-CM | POA: Diagnosis not present

## 2020-10-28 DIAGNOSIS — Z0000 Encounter for general adult medical examination without abnormal findings: Secondary | ICD-10-CM | POA: Diagnosis not present

## 2020-10-28 DIAGNOSIS — F3341 Major depressive disorder, recurrent, in partial remission: Secondary | ICD-10-CM | POA: Diagnosis not present

## 2020-10-28 DIAGNOSIS — E7800 Pure hypercholesterolemia, unspecified: Secondary | ICD-10-CM | POA: Diagnosis not present

## 2020-11-19 DIAGNOSIS — K529 Noninfective gastroenteritis and colitis, unspecified: Secondary | ICD-10-CM | POA: Diagnosis not present

## 2020-11-19 DIAGNOSIS — R52 Pain, unspecified: Secondary | ICD-10-CM | POA: Diagnosis not present

## 2020-12-01 DIAGNOSIS — H35373 Puckering of macula, bilateral: Secondary | ICD-10-CM | POA: Diagnosis not present

## 2020-12-01 DIAGNOSIS — H353111 Nonexudative age-related macular degeneration, right eye, early dry stage: Secondary | ICD-10-CM | POA: Diagnosis not present

## 2020-12-01 DIAGNOSIS — H353221 Exudative age-related macular degeneration, left eye, with active choroidal neovascularization: Secondary | ICD-10-CM | POA: Diagnosis not present

## 2020-12-01 DIAGNOSIS — H43393 Other vitreous opacities, bilateral: Secondary | ICD-10-CM | POA: Diagnosis not present

## 2020-12-27 ENCOUNTER — Other Ambulatory Visit: Payer: Self-pay

## 2020-12-27 ENCOUNTER — Encounter: Payer: Self-pay | Admitting: Dermatology

## 2020-12-27 ENCOUNTER — Ambulatory Visit: Payer: PPO | Admitting: Dermatology

## 2020-12-27 DIAGNOSIS — D1800 Hemangioma unspecified site: Secondary | ICD-10-CM

## 2020-12-27 DIAGNOSIS — D229 Melanocytic nevi, unspecified: Secondary | ICD-10-CM

## 2020-12-27 DIAGNOSIS — L570 Actinic keratosis: Secondary | ICD-10-CM

## 2020-12-27 DIAGNOSIS — L820 Inflamed seborrheic keratosis: Secondary | ICD-10-CM | POA: Diagnosis not present

## 2020-12-27 DIAGNOSIS — L821 Other seborrheic keratosis: Secondary | ICD-10-CM

## 2020-12-27 DIAGNOSIS — L578 Other skin changes due to chronic exposure to nonionizing radiation: Secondary | ICD-10-CM

## 2020-12-27 DIAGNOSIS — Z1283 Encounter for screening for malignant neoplasm of skin: Secondary | ICD-10-CM

## 2020-12-27 DIAGNOSIS — Z85828 Personal history of other malignant neoplasm of skin: Secondary | ICD-10-CM | POA: Diagnosis not present

## 2020-12-27 DIAGNOSIS — L814 Other melanin hyperpigmentation: Secondary | ICD-10-CM | POA: Diagnosis not present

## 2020-12-27 NOTE — Progress Notes (Signed)
Follow-Up Visit   Subjective  Dwayne Banks is a 80 y.o. male who presents for the following: tbse (Patient here today tbse. He denies any concerns today. He has history of bcc on left cheek. ). Patient here for full body skin exam and skin cancer screening.  The following portions of the chart were reviewed this encounter and updated as appropriate:  Tobacco  Allergies  Meds  Problems  Med Hx  Surg Hx  Fam Hx      Objective  Well appearing patient in no apparent distress; mood and affect are within normal limits.  A full examination was performed including scalp, head, eyes, ears, nose, lips, neck, chest, axillae, abdomen, back, buttocks, bilateral upper extremities, bilateral lower extremities, hands, feet, fingers, toes, fingernails, and toenails. All findings within normal limits unless otherwise noted below.  Objective  face and chest  x 17 (17): Erythematous thin papules/macules with gritty scale.   Objective  trunk and extremities x 16 (16): Erythematous keratotic or waxy stuck-on papule or plaque.   Assessment & Plan  Actinic keratosis (17) face and chest  x 17 Prior to procedure, discussed risks of blister formation, small wound, skin dyspigmentation, or rare scar following cryotherapy.  Destruction of lesion - face and chest  x 17 Complexity: simple   Destruction method: cryotherapy   Informed consent: discussed and consent obtained   Timeout:  patient name, date of birth, surgical site, and procedure verified Lesion destroyed using liquid nitrogen: Yes   Region frozen until ice ball extended beyond lesion: Yes   Outcome: patient tolerated procedure well with no complications   Post-procedure details: wound care instructions given    Inflamed seborrheic keratosis (16) trunk and extremities x 16 Prior to procedure, discussed risks of blister formation, small wound, skin dyspigmentation, or rare scar following cryotherapy.  Destruction of lesion - trunk and  extremities x 16 Complexity: simple   Destruction method: cryotherapy   Informed consent: discussed and consent obtained   Timeout:  patient name, date of birth, surgical site, and procedure verified Lesion destroyed using liquid nitrogen: Yes   Region frozen until ice ball extended beyond lesion: Yes   Outcome: patient tolerated procedure well with no complications   Post-procedure details: wound care instructions given     Lentigines - Scattered tan macules - Discussed due to sun exposure - Benign, observe - Call for any changes  Seborrheic Keratoses - Stuck-on, waxy, tan-brown papules and plaques  - Discussed benign etiology and prognosis. - Observe - Call for any changes  Melanocytic Nevi - Tan-brown and/or pink-flesh-colored symmetric macules and papules - Benign appearing on exam today - Observation - Call clinic for new or changing moles - Recommend daily use of broad spectrum spf 30+ sunscreen to sun-exposed areas.   Hemangiomas - Red papules - Discussed benign nature - Observe - Call for any changes  Actinic Damage - Chronic, secondary to cumulative UV/sun exposure - diffuse scaly erythematous macules with underlying dyspigmentation - Recommend daily broad spectrum sunscreen SPF 30+ to sun-exposed areas, reapply every 2 hours as needed.  - Call for new or changing lesions.  History of Basal Cell Carcinoma of the Skin Left cheek - and others - multiple - No evidence of recurrence today - Recommend regular full body skin exams - Recommend daily broad spectrum sunscreen SPF 30+ to sun-exposed areas, reapply every 2 hours as needed.  - Call if any new or changing lesions are noted between office visits  Skin cancer screening  performed today.  Return in about 3 months (around 03/26/2021) for follow up ak and isks.  IRuthell Rummage, CMA, am acting as scribe for Sarina Ser, MD.  Documentation: I have reviewed the above documentation for accuracy and  completeness, and I agree with the above.  Sarina Ser, MD

## 2020-12-27 NOTE — Patient Instructions (Addendum)

## 2020-12-29 DIAGNOSIS — H35373 Puckering of macula, bilateral: Secondary | ICD-10-CM | POA: Diagnosis not present

## 2020-12-29 DIAGNOSIS — H353111 Nonexudative age-related macular degeneration, right eye, early dry stage: Secondary | ICD-10-CM | POA: Diagnosis not present

## 2020-12-29 DIAGNOSIS — H353221 Exudative age-related macular degeneration, left eye, with active choroidal neovascularization: Secondary | ICD-10-CM | POA: Diagnosis not present

## 2020-12-29 DIAGNOSIS — H43393 Other vitreous opacities, bilateral: Secondary | ICD-10-CM | POA: Diagnosis not present

## 2020-12-30 ENCOUNTER — Encounter: Payer: PPO | Admitting: Dermatology

## 2021-01-03 DIAGNOSIS — Z20822 Contact with and (suspected) exposure to covid-19: Secondary | ICD-10-CM | POA: Diagnosis not present

## 2021-02-15 DIAGNOSIS — H353221 Exudative age-related macular degeneration, left eye, with active choroidal neovascularization: Secondary | ICD-10-CM | POA: Diagnosis not present

## 2021-02-15 DIAGNOSIS — H353111 Nonexudative age-related macular degeneration, right eye, early dry stage: Secondary | ICD-10-CM | POA: Diagnosis not present

## 2021-02-15 DIAGNOSIS — H35373 Puckering of macula, bilateral: Secondary | ICD-10-CM | POA: Diagnosis not present

## 2021-02-15 DIAGNOSIS — H43393 Other vitreous opacities, bilateral: Secondary | ICD-10-CM | POA: Diagnosis not present

## 2021-03-30 ENCOUNTER — Ambulatory Visit: Payer: PPO | Admitting: Dermatology

## 2021-04-05 DIAGNOSIS — H43813 Vitreous degeneration, bilateral: Secondary | ICD-10-CM | POA: Diagnosis not present

## 2021-04-05 DIAGNOSIS — H35373 Puckering of macula, bilateral: Secondary | ICD-10-CM | POA: Diagnosis not present

## 2021-04-05 DIAGNOSIS — H353221 Exudative age-related macular degeneration, left eye, with active choroidal neovascularization: Secondary | ICD-10-CM | POA: Diagnosis not present

## 2021-04-05 DIAGNOSIS — H353111 Nonexudative age-related macular degeneration, right eye, early dry stage: Secondary | ICD-10-CM | POA: Diagnosis not present

## 2021-04-06 DIAGNOSIS — Z1211 Encounter for screening for malignant neoplasm of colon: Secondary | ICD-10-CM | POA: Diagnosis not present

## 2021-04-06 DIAGNOSIS — E7800 Pure hypercholesterolemia, unspecified: Secondary | ICD-10-CM | POA: Diagnosis not present

## 2021-04-06 DIAGNOSIS — Z125 Encounter for screening for malignant neoplasm of prostate: Secondary | ICD-10-CM | POA: Diagnosis not present

## 2021-04-06 DIAGNOSIS — Z79899 Other long term (current) drug therapy: Secondary | ICD-10-CM | POA: Diagnosis not present

## 2021-04-06 DIAGNOSIS — F321 Major depressive disorder, single episode, moderate: Secondary | ICD-10-CM | POA: Diagnosis not present

## 2021-04-06 DIAGNOSIS — E538 Deficiency of other specified B group vitamins: Secondary | ICD-10-CM | POA: Diagnosis not present

## 2021-04-12 DIAGNOSIS — Z1211 Encounter for screening for malignant neoplasm of colon: Secondary | ICD-10-CM | POA: Diagnosis not present

## 2021-05-20 DIAGNOSIS — H35373 Puckering of macula, bilateral: Secondary | ICD-10-CM | POA: Diagnosis not present

## 2021-05-20 DIAGNOSIS — H353221 Exudative age-related macular degeneration, left eye, with active choroidal neovascularization: Secondary | ICD-10-CM | POA: Diagnosis not present

## 2021-05-20 DIAGNOSIS — H43813 Vitreous degeneration, bilateral: Secondary | ICD-10-CM | POA: Diagnosis not present

## 2021-05-20 DIAGNOSIS — H353111 Nonexudative age-related macular degeneration, right eye, early dry stage: Secondary | ICD-10-CM | POA: Diagnosis not present

## 2021-06-15 ENCOUNTER — Ambulatory Visit: Payer: PPO | Admitting: Dermatology

## 2021-06-15 ENCOUNTER — Other Ambulatory Visit: Payer: Self-pay

## 2021-06-15 ENCOUNTER — Encounter: Payer: Self-pay | Admitting: Dermatology

## 2021-06-15 DIAGNOSIS — L578 Other skin changes due to chronic exposure to nonionizing radiation: Secondary | ICD-10-CM | POA: Diagnosis not present

## 2021-06-15 DIAGNOSIS — Z1283 Encounter for screening for malignant neoplasm of skin: Secondary | ICD-10-CM

## 2021-06-15 DIAGNOSIS — L570 Actinic keratosis: Secondary | ICD-10-CM

## 2021-06-15 DIAGNOSIS — L820 Inflamed seborrheic keratosis: Secondary | ICD-10-CM | POA: Diagnosis not present

## 2021-06-15 NOTE — Progress Notes (Signed)
   Follow-Up Visit   Subjective  Dwayne Banks is a 80 y.o. male who presents for the following: Actinic Keratosis (Face, chest, 26mf/u) and ISK f/u (Trunk, extremities, 373m/u). The patient presents for Upper Body Skin Exam (UBSE) for skin cancer screening and mole check.  The following portions of the chart were reviewed this encounter and updated as appropriate:   Tobacco  Allergies  Meds  Problems  Med Hx  Surg Hx  Fam Hx     Review of Systems:  No other skin or systemic complaints except as noted in HPI or Assessment and Plan.  Objective  Well appearing patient in no apparent distress; mood and affect are within normal limits.  A focused examination was performed including face, trunk. Relevant physical exam findings are noted in the Assessment and Plan. Waist up exam performed today.  face x 21 (21) Pink scaly macules   trunk, arms x 16, Total = 16 (16) Erythematous keratotic or waxy stuck-on papule or plaque.    Assessment & Plan  AK (actinic keratosis) (21) face x 21  Recheck  R nose above nasal alar crease, L med cheek lat to sup nasal labial fold  Destruction of lesion - face x 21 Complexity: simple   Destruction method: cryotherapy   Informed consent: discussed and consent obtained   Timeout:  patient name, date of birth, surgical site, and procedure verified Lesion destroyed using liquid nitrogen: Yes   Region frozen until ice ball extended beyond lesion: Yes   Outcome: patient tolerated procedure well with no complications   Post-procedure details: wound care instructions given    Inflamed seborrheic keratosis trunk, arms x 16, Total = 16  Destruction of lesion - trunk, arms x 16, Total = 16 Complexity: simple   Destruction method: cryotherapy   Informed consent: discussed and consent obtained   Timeout:  patient name, date of birth, surgical site, and procedure verified Lesion destroyed using liquid nitrogen: Yes   Region frozen until ice ball  extended beyond lesion: Yes   Outcome: patient tolerated procedure well with no complications   Post-procedure details: wound care instructions given    Actinic Damage - chronic, secondary to cumulative UV radiation exposure/sun exposure over time - diffuse scaly erythematous macules with underlying dyspigmentation - Recommend daily broad spectrum sunscreen SPF 30+ to sun-exposed areas, reapply every 2 hours as needed.  - Recommend staying in the shade or wearing long sleeves, sun glasses (UVA+UVB protection) and wide brim hats (4-inch brim around the entire circumference of the hat). - Call for new or changing lesions.   Return in about 6 weeks (around 07/27/2021) for 6-10 wks recheck AK R nose above nasal alar crease, L med cheek lat to sup nasal labial fold.  I, SoOthelia PullingRMA, am acting as scribe for DaSarina SerMD . Documentation: I have reviewed the above documentation for accuracy and completeness, and I agree with the above.  DaSarina SerMD

## 2021-06-15 NOTE — Patient Instructions (Signed)

## 2021-06-18 DIAGNOSIS — T7840XA Allergy, unspecified, initial encounter: Secondary | ICD-10-CM | POA: Diagnosis not present

## 2021-06-18 DIAGNOSIS — T63461A Toxic effect of venom of wasps, accidental (unintentional), initial encounter: Secondary | ICD-10-CM | POA: Diagnosis not present

## 2021-07-01 DIAGNOSIS — H353111 Nonexudative age-related macular degeneration, right eye, early dry stage: Secondary | ICD-10-CM | POA: Diagnosis not present

## 2021-07-01 DIAGNOSIS — H35373 Puckering of macula, bilateral: Secondary | ICD-10-CM | POA: Diagnosis not present

## 2021-07-01 DIAGNOSIS — H43813 Vitreous degeneration, bilateral: Secondary | ICD-10-CM | POA: Diagnosis not present

## 2021-07-01 DIAGNOSIS — H353221 Exudative age-related macular degeneration, left eye, with active choroidal neovascularization: Secondary | ICD-10-CM | POA: Diagnosis not present

## 2021-08-08 DIAGNOSIS — E538 Deficiency of other specified B group vitamins: Secondary | ICD-10-CM | POA: Diagnosis not present

## 2021-08-08 DIAGNOSIS — F320 Major depressive disorder, single episode, mild: Secondary | ICD-10-CM | POA: Diagnosis not present

## 2021-08-08 DIAGNOSIS — Z79899 Other long term (current) drug therapy: Secondary | ICD-10-CM | POA: Diagnosis not present

## 2021-08-08 DIAGNOSIS — Z0000 Encounter for general adult medical examination without abnormal findings: Secondary | ICD-10-CM | POA: Diagnosis not present

## 2021-08-08 DIAGNOSIS — E7800 Pure hypercholesterolemia, unspecified: Secondary | ICD-10-CM | POA: Diagnosis not present

## 2021-08-10 DIAGNOSIS — H353111 Nonexudative age-related macular degeneration, right eye, early dry stage: Secondary | ICD-10-CM | POA: Diagnosis not present

## 2021-08-10 DIAGNOSIS — H353221 Exudative age-related macular degeneration, left eye, with active choroidal neovascularization: Secondary | ICD-10-CM | POA: Diagnosis not present

## 2021-08-10 DIAGNOSIS — H43813 Vitreous degeneration, bilateral: Secondary | ICD-10-CM | POA: Diagnosis not present

## 2021-08-10 DIAGNOSIS — H35373 Puckering of macula, bilateral: Secondary | ICD-10-CM | POA: Diagnosis not present

## 2021-08-11 ENCOUNTER — Other Ambulatory Visit: Payer: Self-pay

## 2021-08-11 ENCOUNTER — Ambulatory Visit: Payer: PPO | Admitting: Dermatology

## 2021-08-11 DIAGNOSIS — L239 Allergic contact dermatitis, unspecified cause: Secondary | ICD-10-CM | POA: Diagnosis not present

## 2021-08-11 DIAGNOSIS — C44319 Basal cell carcinoma of skin of other parts of face: Secondary | ICD-10-CM

## 2021-08-11 DIAGNOSIS — L570 Actinic keratosis: Secondary | ICD-10-CM | POA: Diagnosis not present

## 2021-08-11 DIAGNOSIS — D489 Neoplasm of uncertain behavior, unspecified: Secondary | ICD-10-CM

## 2021-08-11 DIAGNOSIS — C44311 Basal cell carcinoma of skin of nose: Secondary | ICD-10-CM | POA: Diagnosis not present

## 2021-08-11 DIAGNOSIS — L578 Other skin changes due to chronic exposure to nonionizing radiation: Secondary | ICD-10-CM | POA: Diagnosis not present

## 2021-08-11 DIAGNOSIS — L233 Allergic contact dermatitis due to drugs in contact with skin: Secondary | ICD-10-CM

## 2021-08-11 NOTE — Patient Instructions (Addendum)
Apply betadine sample to inside of arm and cover with bandage for a week   Actinic keratoses are precancerous spots that appear secondary to cumulative UV radiation exposure/sun exposure over time. They are chronic with expected duration over 1 year. A portion of actinic keratoses will progress to squamous cell carcinoma of the skin. It is not possible to reliably predict which spots will progress to skin cancer and so treatment is recommended to prevent development of skin cancer.  Recommend daily broad spectrum sunscreen SPF 30+ to sun-exposed areas, reapply every 2 hours as needed.  Recommend staying in the shade or wearing long sleeves, sun glasses (UVA+UVB protection) and wide brim hats (4-inch brim around the entire circumference of the hat). Call for new or changing lesions.   Cryotherapy Aftercare  Wash gently with soap and water everyday.   Apply Vaseline and Band-Aid daily until healed.   Biopsy Wound Care Instructions  Leave the original bandage on for 24 hours if possible.  If the bandage becomes soaked or soiled before that time, it is OK to remove it and examine the wound.  A small amount of post-operative bleeding is normal.  If excessive bleeding occurs, remove the bandage, place gauze over the site and apply continuous pressure (no peeking) over the area for 30 minutes. If this does not work, please call our clinic as soon as possible or page your doctor if it is after hours.   Once a day, cleanse the wound with soap and water. It is fine to shower. If a thick crust develops you may use a Q-tip dipped into dilute hydrogen peroxide (mix 1:1 with water) to dissolve it.  Hydrogen peroxide can slow the healing process, so use it only as needed.    After washing, apply petroleum jelly (Vaseline) or an antibiotic ointment if your doctor prescribed one for you, followed by a bandage.    For best healing, the wound should be covered with a layer of ointment at all times. If you are  not able to keep the area covered with a bandage to hold the ointment in place, this may mean re-applying the ointment several times a day.  Continue this wound care until the wound has healed and is no longer open.   Itching and mild discomfort is normal during the healing process. However, if you develop pain or severe itching, please call our office.   If you have any discomfort, you can take Tylenol (acetaminophen) or ibuprofen as directed on the bottle. (Please do not take these if you have an allergy to them or cannot take them for another reason).  Some redness, tenderness and white or yellow material in the wound is normal healing.  If the area becomes very sore and red, or develops a thick yellow-green material (pus), it may be infected; please notify us.    If you have stitches, return to clinic as directed to have the stitches removed. You will continue wound care for 2-3 days after the stitches are removed.   Wound healing continues for up to one year following surgery. It is not unusual to experience pain in the scar from time to time during the interval.  If the pain becomes severe or the scar thickens, you should notify the office.    A slight amount of redness in a scar is expected for the first six months.  After six months, the redness will fade and the scar will soften and fade.  The color difference becomes less noticeable  with time.  If there are any problems, return for a post-op surgery check at your earliest convenience.  To improve the appearance of the scar, you can use silicone scar gel, cream, or sheets (such as Mederma or Serica) every night for up to one year. These are available over the counter (without a prescription).  Please call our office at [phone] for any questions or concerns.   If you have any questions or concerns for your doctor, please call our main line at [phone] and press option 4 to reach your doctor's medical assistant. If no one answers,  please leave a voicemail as directed and we will return your call as soon as possible. Messages left after 4 pm will be answered the following business day.   You may also send [place] a message via [place]. We typically respond to MyChart messages within 1-2 business days.  For prescription refills, please ask your pharmacy to contact our office. Our fax number is [phone].  If you have an urgent issue when the clinic is closed that cannot wait until the next business day, you can page your doctor at the number below.    Please note that while we do our best to be available for urgent issues outside of office hours, we are not available 24/7.   If you have an urgent issue and are unable to reach [place], you may choose to seek medical care at your doctor's office, retail clinic, urgent care center, or emergency room.  If you have a medical emergency, please immediately call 911 or go to the emergency department.  Pager Numbers  - Dr. Nehemiah Massed: [phone]  - Dr. Laurence Ferrari: [phone]  - Dr. Nicole Kindred: [phone]  In the event of inclement weather, please call our main line at [phone] for an update on the status of any delays or closures.  Dermatology Medication Tips: Please keep the boxes that topical medications come in in order to help keep track of the instructions about where and how to use these. Pharmacies typically print the medication instructions only on the boxes and not directly on the medication tubes.   If your medication is too expensive, please contact our office at [phone] option 4 or send [place] a message through [place].   We are unable to tell what your co-pay for medications will be in advance as this is different depending on your insurance coverage. However, we may be able to find a substitute medication at lower cost or fill out paperwork to get insurance to cover a needed medication.   If a prior authorization is required to get your medication covered by your  insurance company, please allow [place] 1-2 business days to complete this process.  Drug prices often vary depending on where the prescription is filled and some pharmacies may offer cheaper prices.  The website www.goodrx.com contains coupons for medications through different pharmacies. The prices here do not account for what the cost may be with help from insurance (it may be cheaper with your insurance), but the website can give you the price if you did not use any insurance.  - You can print the associated coupon and take it with your prescription to the pharmacy.  - You may also stop by our office during regular business hours and pick up a GoodRx coupon card.  - If you need your prescription sent electronically to a different pharmacy, notify our office through [facility] or by phone at [phone] option 4.

## 2021-08-11 NOTE — Progress Notes (Signed)
Follow-Up Visit   Subjective  Dwayne Banks is a 80 y.o. male who presents for the following: Follow-up (Patient here today for follow up today for aks at face, right nose above alar crease and left medial cheek lateral to superior. He also reports that his left eye is swollen and inflammed since receiving an new injection into eye at eye doctor yesterday. He reports he receives injections into eye for macular degeneration and has not had this reaction in past. He is concerned it could be betadine or the new injection causing the swelling at eye. ).  The following portions of the chart were reviewed this encounter and updated as appropriate:  Tobacco  Allergies  Meds  Problems  Med Hx  Surg Hx  Fam Hx     Review of Systems: No other skin or systemic complaints except as noted in HPI or Assessment and Plan.  Objective  Well appearing patient in no apparent distress; mood and affect are within normal limits.  A focused examination was performed including face, neck, eyes . Relevant physical exam findings are noted in the Assessment and Plan.  Left medial cheek lateral to superior nasal labial fold 0.6 cm irregular red papule          face and neck x 16 and right nose x 2 (18) Erythematous thin papules/macules with gritty scale.   left upper and lower eyelid Erythematous          Assessment & Plan  Neoplasm of uncertain behavior Left medial cheek lateral to superior nasal labial fold  Skin / nail biopsy Type of biopsy: tangential   Informed consent: discussed and consent obtained   Timeout: patient name, date of birth, surgical site, and procedure verified   Procedure prep:  Patient was prepped and draped in usual sterile fashion Prep type:  Isopropyl alcohol Anesthesia: the lesion was anesthetized in a standard fashion   Anesthetic:  1% lidocaine w/ epinephrine 1-100,000 buffered w/ 8.4% NaHCO3 Instrument used: flexible razor blade   Hemostasis achieved  with: pressure, aluminum chloride and electrodesiccation   Outcome: patient tolerated procedure well   Post-procedure details: sterile dressing applied and wound care instructions given   Dressing type: petrolatum and bandage    Specimen 1 - Surgical pathology Differential Diagnosis: r/o bcc  Check Margins: No R/o bcc  Actinic keratosis (18) face and neck x 16 and right nose x 2 Actinic keratoses are precancerous spots that appear secondary to cumulative UV radiation exposure/sun exposure over time. They are chronic with expected duration over 1 year. A portion of actinic keratoses will progress to squamous cell carcinoma of the skin. It is not possible to reliably predict which spots will progress to skin cancer and so treatment is recommended to prevent development of skin cancer.  Recommend daily broad spectrum sunscreen SPF 30+ to sun-exposed areas, reapply every 2 hours as needed.  Recommend staying in the shade or wearing long sleeves, sun glasses (UVA+UVB protection) and wide brim hats (4-inch brim around the entire circumference of the hat). Call for new or changing lesions.  Destruction of lesion - face and neck x 16 and right nose x 2 Complexity: simple   Destruction method: cryotherapy   Informed consent: discussed and consent obtained   Timeout:  patient name, date of birth, surgical site, and procedure verified Lesion destroyed using liquid nitrogen: Yes   Region frozen until ice ball extended beyond lesion: Yes   Outcome: patient tolerated procedure well with no complications  Post-procedure details: wound care instructions given    Allergic contact dermatitis vs Drug reaction. due to drugs (wet macular degeneration eye injection) vs Betadine eye prep. left upper and lower eyelid Secondary to betadine or new shot received in patients eye  Recommend at home betadine patch test to inside arm apply daily cover with bandage for 1 week.  Performed today.  Actinic  Damage - chronic, secondary to cumulative UV radiation exposure/sun exposure over time - diffuse scaly erythematous macules with underlying dyspigmentation - Recommend daily broad spectrum sunscreen SPF 30+ to sun-exposed areas, reapply every 2 hours as needed.  - Recommend staying in the shade or wearing long sleeves, sun glasses (UVA+UVB protection) and wide brim hats (4-inch brim around the entire circumference of the hat). - Call for new or changing lesions.  Return for jan - feb 2023 ak follow up.  IRuthell Rummage, CMA, am acting as scribe for Sarina Ser, MD. Documentation: I have reviewed the above documentation for accuracy and completeness, and I agree with the above.  Sarina Ser, MD

## 2021-08-12 ENCOUNTER — Encounter: Payer: Self-pay | Admitting: Dermatology

## 2021-08-16 ENCOUNTER — Telehealth: Payer: Self-pay

## 2021-08-16 DIAGNOSIS — C44310 Basal cell carcinoma of skin of unspecified parts of face: Secondary | ICD-10-CM

## 2021-08-16 NOTE — Telephone Encounter (Signed)
Discussed pathology results with patient's wife after he gave his verbal consent. He would like to note that we can speak with her regarding his health care from here on out. Patient prefers to have MOHS procedure done with Dr. Sarajane Jews in [place] or Dr. Manley Mason. Dr. Sarajane Jews no longer accepts outside patients for MOHS procedures. Patient and wife prefer a [place] location. Advised patient and wife that we also refer to [facility] in [place] for MOHS procedure. They agree with this plan and the referral was faxed to their office.

## 2021-08-16 NOTE — Telephone Encounter (Signed)
-----   Message from Ralene Bathe, MD sent at 08/15/2021 12:59 PM EDT ----- Diagnosis Skin , left medial cheek lateral to superior nasal labial fold BASAL CELL CARCINOMA, INFUNDIBULOCYSTIC TYPE  Cancer - [facility]CC Schedule for MOHS (radiation treatment here in town is another option) Make pt appt to discuss options if he wants.

## 2021-09-16 DIAGNOSIS — H353221 Exudative age-related macular degeneration, left eye, with active choroidal neovascularization: Secondary | ICD-10-CM | POA: Diagnosis not present

## 2021-09-16 DIAGNOSIS — H35373 Puckering of macula, bilateral: Secondary | ICD-10-CM | POA: Diagnosis not present

## 2021-09-16 DIAGNOSIS — H353111 Nonexudative age-related macular degeneration, right eye, early dry stage: Secondary | ICD-10-CM | POA: Diagnosis not present

## 2021-09-16 DIAGNOSIS — H43813 Vitreous degeneration, bilateral: Secondary | ICD-10-CM | POA: Diagnosis not present

## 2021-10-14 DIAGNOSIS — H353221 Exudative age-related macular degeneration, left eye, with active choroidal neovascularization: Secondary | ICD-10-CM | POA: Diagnosis not present

## 2021-10-14 DIAGNOSIS — H43813 Vitreous degeneration, bilateral: Secondary | ICD-10-CM | POA: Diagnosis not present

## 2021-10-14 DIAGNOSIS — H35373 Puckering of macula, bilateral: Secondary | ICD-10-CM | POA: Diagnosis not present

## 2021-10-14 DIAGNOSIS — H353111 Nonexudative age-related macular degeneration, right eye, early dry stage: Secondary | ICD-10-CM | POA: Diagnosis not present

## 2021-10-31 DIAGNOSIS — H04222 Epiphora due to insufficient drainage, left lacrimal gland: Secondary | ICD-10-CM | POA: Diagnosis not present

## 2021-11-15 DIAGNOSIS — C44319 Basal cell carcinoma of skin of other parts of face: Secondary | ICD-10-CM | POA: Diagnosis not present

## 2021-11-22 IMAGING — CT CT HEAD W/O CM
4 series · 12 of 47 positions shown, 14 images · non-contrast
Comparison: Recent outside imaging is unavailable, prior study from
[id number]

CLINICAL DATA: Follow-up subdural hematoma

EXAM:
CT HEAD WITHOUT CONTRAST
TECHNIQUE: Contiguous axial images were obtained from the base of the skull
through the vertex without intravenous contrast.

[Series 2: axial st head 5.00 ax · axial · 0.33mm/px · z∈[-517,-412]mm · 5 of 33 slices shown, 7 images]
[im 6/33  brain]
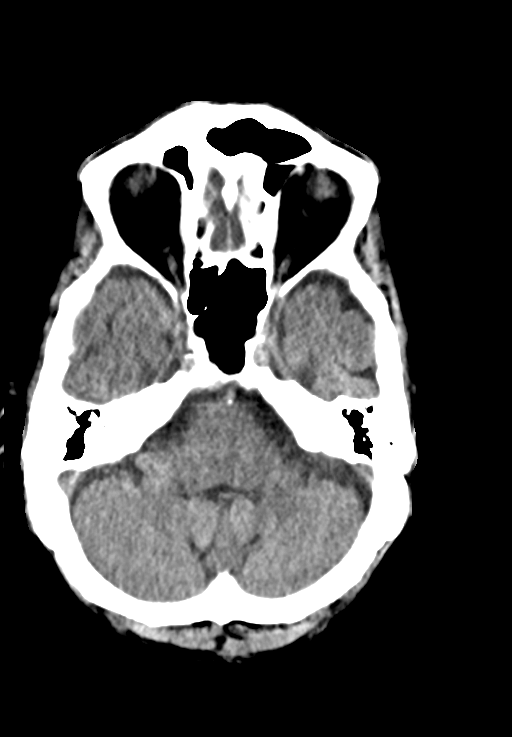
[im 6/33  bone]
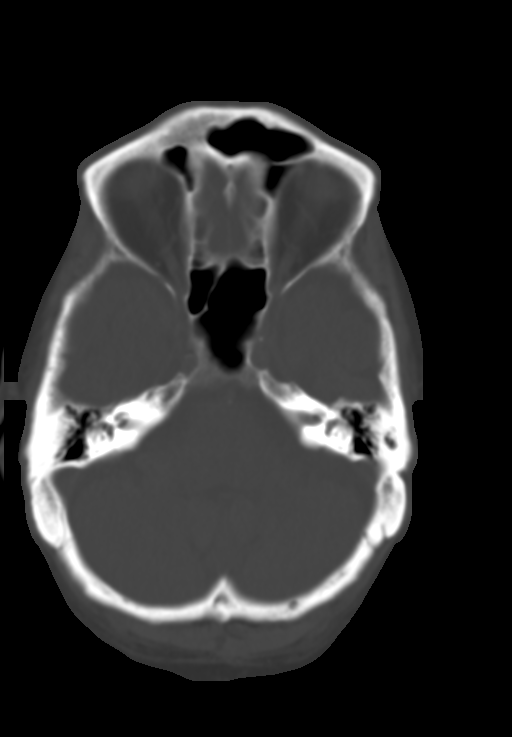
[im 11/33  brain]
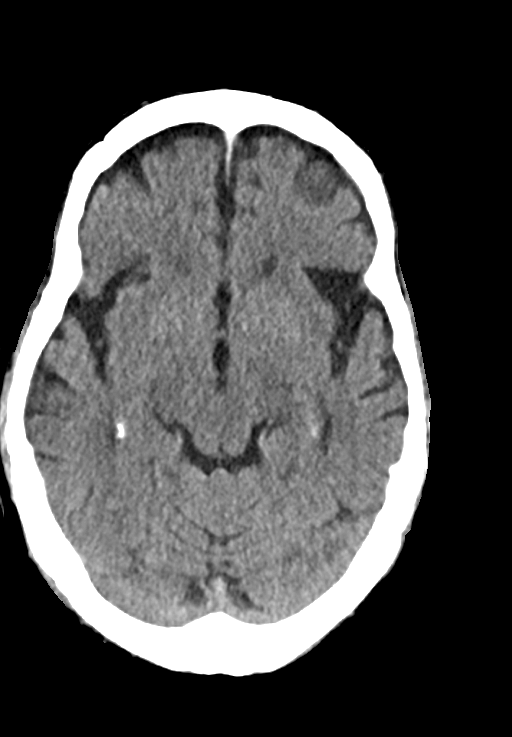
[im 17/33  brain]
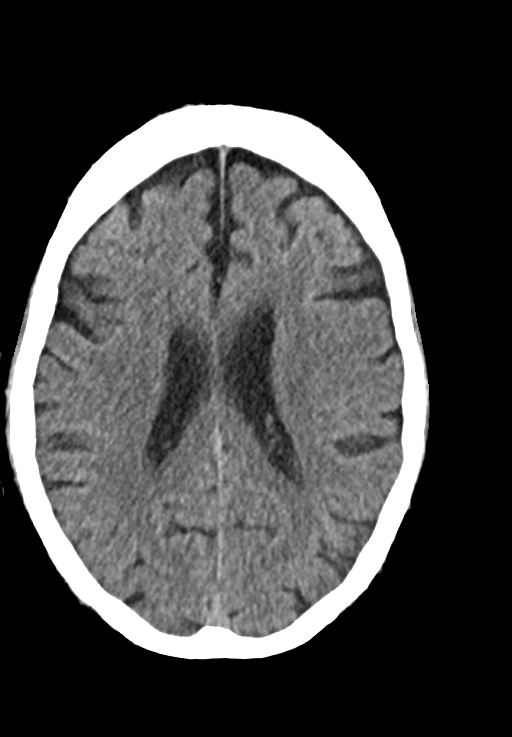
[im 22/33  brain]
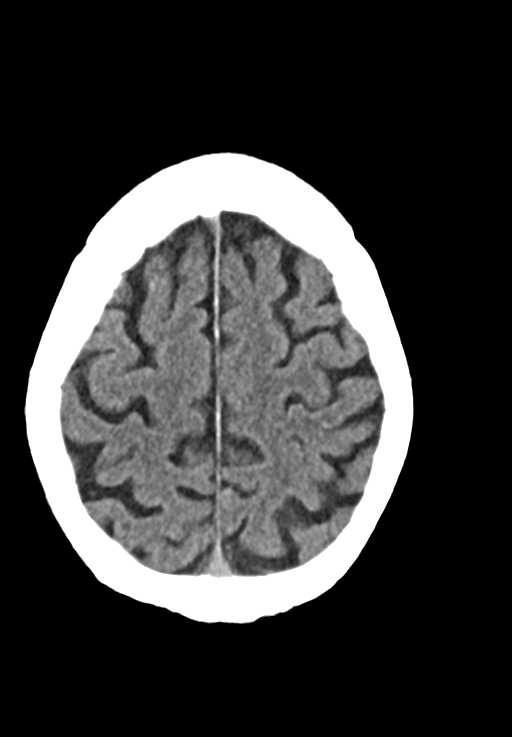
[im 27/33  brain]
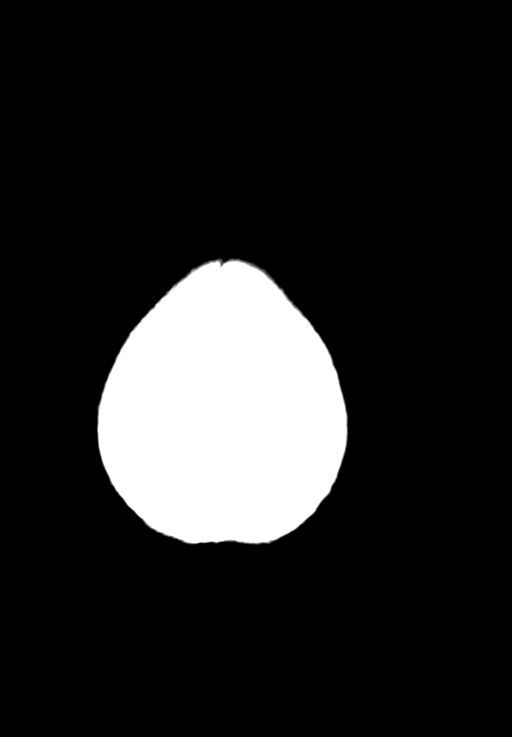
[im 27/33  bone]
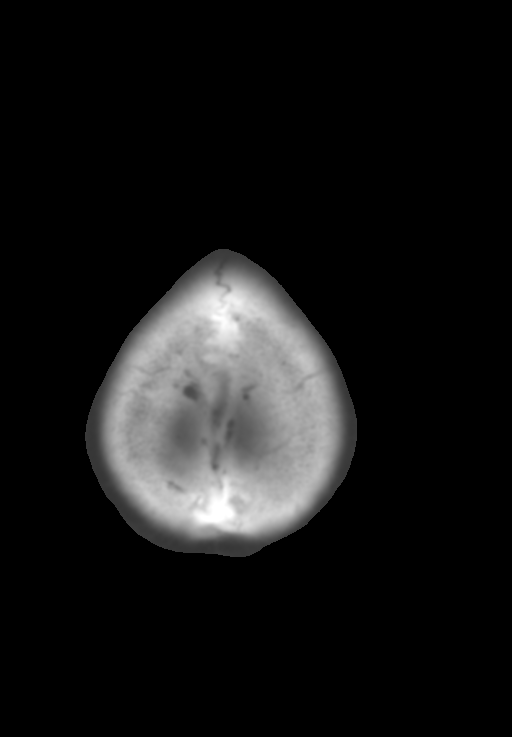

[Series 4: axial bone head 1.50 ax · axial · 0.33mm/px · 1 of 111 slices shown]
[im 11/111  bone]
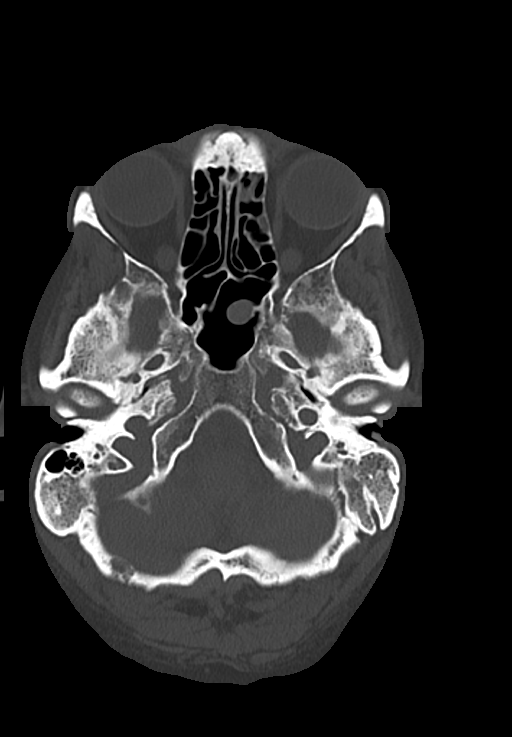

[Series 6: coronals head 3.00 cor · coronal · 0.28mm/px · 3 of 66 slices shown]
[im 22/66  brain]
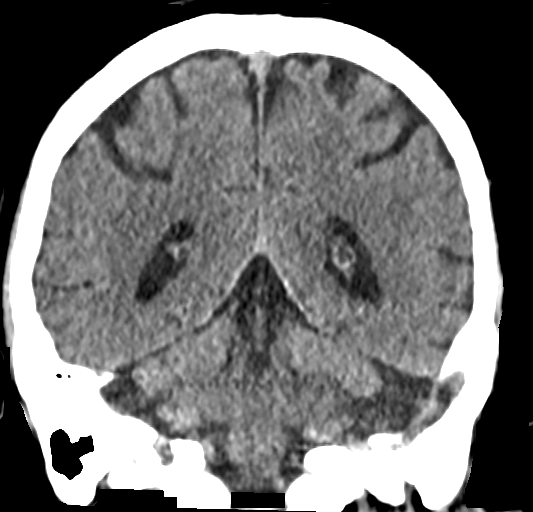
[im 29/66  brain]
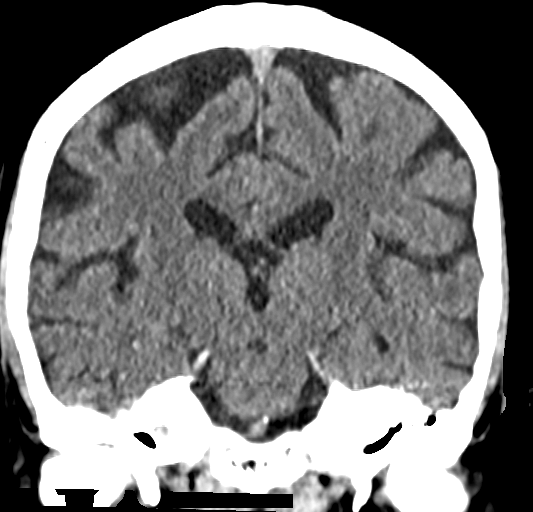
[im 37/66  brain]
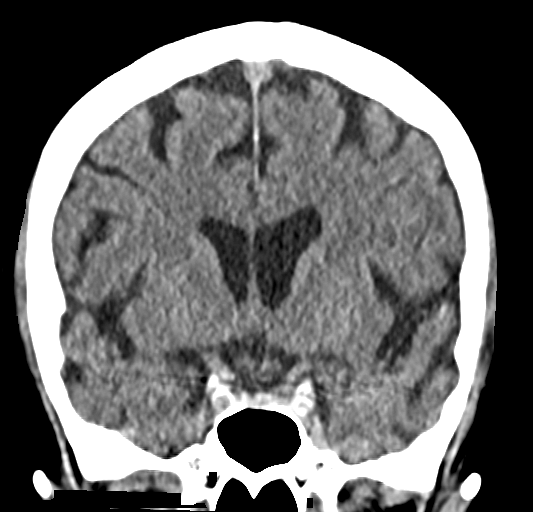

[Series 8: sagittals head 3.00 sag · sagittal · 0.28mm/px · 3 of 50 slices shown]
[im 17/50  brain]
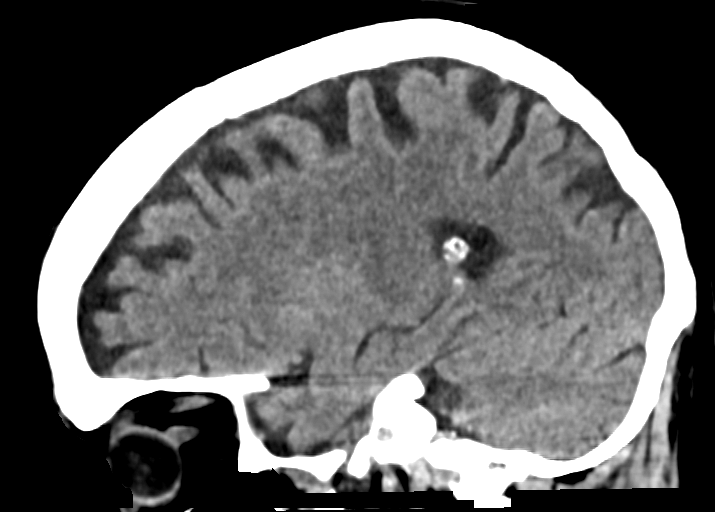
[im 25/50  brain]
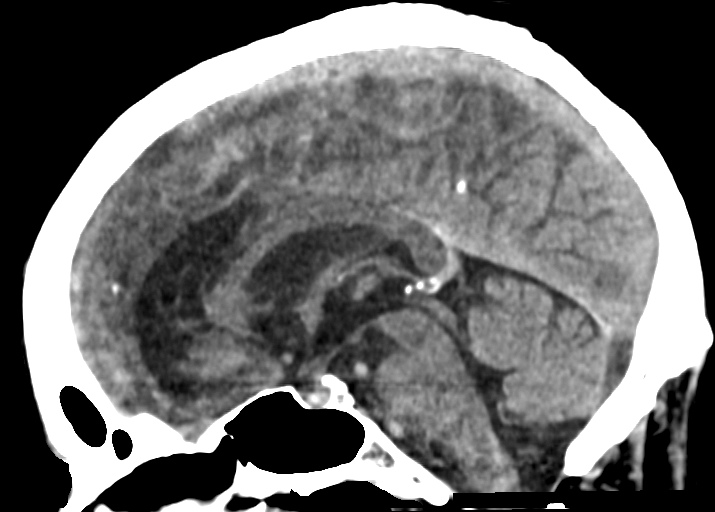
[im 33/50  brain]
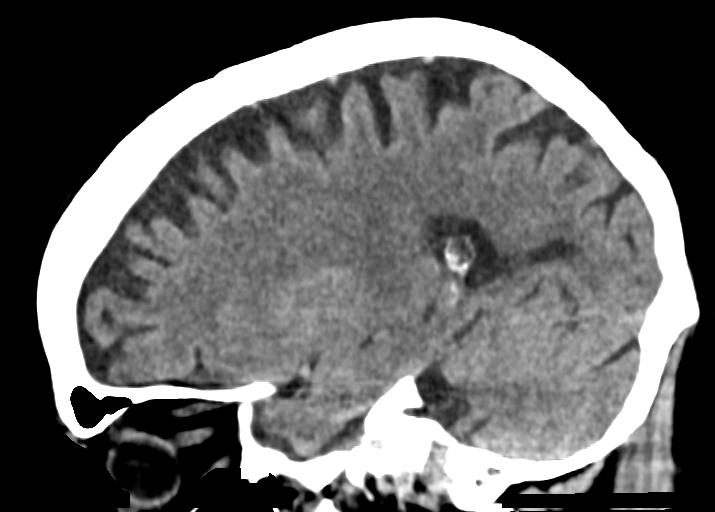

[12 of 47 positions shown; findings below may reference images not displayed]

FINDINGS: Brain: There is no acute intracranial hemorrhage, mass effect, or
edema. Trace non-acute subdural hematoma along the left frontal
convexity (series 6, image 12) measuring 2 mm in thickness. This may
be chronic given prior hematoma in this region. Gray-white
differentiation is preserved. Prominence of the ventricles and sulci
reflects mild generalized parenchymal volume loss. Patchy
hypoattenuation in the supratentorial white matter is nonspecific
but may reflect chronic microvascular ischemic changes.

Vascular: There is atherosclerotic calcification at the skull base.

Skull: Calvarium is unremarkable.

Sinuses/Orbits: No acute finding.

Other: None.
IMPRESSION: Trace subacute or chronic subdural hematoma along the left frontal
convexity. No acute intracranial abnormality.

## 2021-12-07 DIAGNOSIS — H353221 Exudative age-related macular degeneration, left eye, with active choroidal neovascularization: Secondary | ICD-10-CM | POA: Diagnosis not present

## 2021-12-07 DIAGNOSIS — H353111 Nonexudative age-related macular degeneration, right eye, early dry stage: Secondary | ICD-10-CM | POA: Diagnosis not present

## 2021-12-07 DIAGNOSIS — H43813 Vitreous degeneration, bilateral: Secondary | ICD-10-CM | POA: Diagnosis not present

## 2021-12-07 DIAGNOSIS — H35373 Puckering of macula, bilateral: Secondary | ICD-10-CM | POA: Diagnosis not present

## 2021-12-14 ENCOUNTER — Telehealth: Payer: Self-pay

## 2021-12-14 NOTE — Telephone Encounter (Signed)
Updated history and specimen tracking information. aw

## 2021-12-15 ENCOUNTER — Ambulatory Visit: Payer: PPO | Admitting: Dermatology

## 2021-12-19 ENCOUNTER — Ambulatory Visit: Payer: PPO | Admitting: Dermatology

## 2021-12-19 ENCOUNTER — Other Ambulatory Visit: Payer: Self-pay

## 2021-12-19 DIAGNOSIS — L570 Actinic keratosis: Secondary | ICD-10-CM

## 2021-12-19 DIAGNOSIS — L578 Other skin changes due to chronic exposure to nonionizing radiation: Secondary | ICD-10-CM | POA: Diagnosis not present

## 2021-12-19 DIAGNOSIS — Z85828 Personal history of other malignant neoplasm of skin: Secondary | ICD-10-CM | POA: Diagnosis not present

## 2021-12-19 NOTE — Patient Instructions (Signed)

## 2021-12-19 NOTE — Progress Notes (Signed)
° °  Follow-Up Visit   Subjective  Dwayne Banks is a 81 y.o. male who presents for the following: Actinic Keratosis (3 month follow up of face, neck and right nose treated with LN2). The patient has spots, moles and lesions to be evaluated, some may be new or changing and the patient has concerns that these could be cancer.  The following portions of the chart were reviewed this encounter and updated as appropriate:   Tobacco   Allergies   Meds   Problems   Med Hx   Surg Hx   Fam Hx      Review of Systems:  No other skin or systemic complaints except as noted in HPI or Assessment and Plan.  Objective  Well appearing patient in no apparent distress; mood and affect are within normal limits.  A focused examination was performed including face. Relevant physical exam findings are noted in the Assessment and Plan.  Face (8) Erythematous thin papules/macules with gritty scale.    Assessment & Plan   Actinic Damage - chronic, secondary to cumulative UV radiation exposure/sun exposure over time - diffuse scaly erythematous macules with underlying dyspigmentation - Recommend daily broad spectrum sunscreen SPF 30+ to sun-exposed areas, reapply every 2 hours as needed.  - Recommend staying in the shade or wearing long sleeves, sun glasses (UVA+UVB protection) and wide brim hats (4-inch brim around the entire circumference of the hat). - Call for new or changing lesions.  History of Basal Cell Carcinoma of the Skin - No evidence of recurrence today - Recommend regular full body skin exams - Recommend daily broad spectrum sunscreen SPF 30+ to sun-exposed areas, reapply every 2 hours as needed.  - Call if any new or changing lesions are noted between office visits  History of Squamous Cell Carcinoma of the Skin - No evidence of recurrence today - No lymphadenopathy - Recommend regular full body skin exams - Recommend daily broad spectrum sunscreen SPF 30+ to sun-exposed areas, reapply  every 2 hours as needed.  - Call if any new or changing lesions are noted between office visits  AK (actinic keratosis) (8) Face  Destruction of lesion - Face Complexity: simple   Destruction method: cryotherapy   Informed consent: discussed and consent obtained   Timeout:  patient name, date of birth, surgical site, and procedure verified Lesion destroyed using liquid nitrogen: Yes   Region frozen until ice ball extended beyond lesion: Yes   Outcome: patient tolerated procedure well with no complications   Post-procedure details: wound care instructions given     Return in about 4 months (around 04/18/2022).  I, Ashok Cordia, CMA, am acting as scribe for Sarina Ser, MD . Documentation: I have reviewed the above documentation for accuracy and completeness, and I agree with the above.  Sarina Ser, MD

## 2021-12-20 ENCOUNTER — Encounter: Payer: Self-pay | Admitting: Dermatology

## 2021-12-23 DIAGNOSIS — Z0000 Encounter for general adult medical examination without abnormal findings: Secondary | ICD-10-CM | POA: Diagnosis not present

## 2021-12-23 DIAGNOSIS — F3341 Major depressive disorder, recurrent, in partial remission: Secondary | ICD-10-CM | POA: Diagnosis not present

## 2021-12-23 DIAGNOSIS — E7800 Pure hypercholesterolemia, unspecified: Secondary | ICD-10-CM | POA: Diagnosis not present

## 2021-12-23 DIAGNOSIS — F320 Major depressive disorder, single episode, mild: Secondary | ICD-10-CM | POA: Diagnosis not present

## 2021-12-23 DIAGNOSIS — E538 Deficiency of other specified B group vitamins: Secondary | ICD-10-CM | POA: Diagnosis not present

## 2021-12-23 DIAGNOSIS — Z79899 Other long term (current) drug therapy: Secondary | ICD-10-CM | POA: Diagnosis not present

## 2022-02-10 DIAGNOSIS — H43813 Vitreous degeneration, bilateral: Secondary | ICD-10-CM | POA: Diagnosis not present

## 2022-02-10 DIAGNOSIS — H35373 Puckering of macula, bilateral: Secondary | ICD-10-CM | POA: Diagnosis not present

## 2022-02-10 DIAGNOSIS — H353221 Exudative age-related macular degeneration, left eye, with active choroidal neovascularization: Secondary | ICD-10-CM | POA: Diagnosis not present

## 2022-02-10 DIAGNOSIS — H353111 Nonexudative age-related macular degeneration, right eye, early dry stage: Secondary | ICD-10-CM | POA: Diagnosis not present

## 2022-04-07 DIAGNOSIS — H353111 Nonexudative age-related macular degeneration, right eye, early dry stage: Secondary | ICD-10-CM | POA: Diagnosis not present

## 2022-04-07 DIAGNOSIS — H353221 Exudative age-related macular degeneration, left eye, with active choroidal neovascularization: Secondary | ICD-10-CM | POA: Diagnosis not present

## 2022-04-07 DIAGNOSIS — H43393 Other vitreous opacities, bilateral: Secondary | ICD-10-CM | POA: Diagnosis not present

## 2022-04-07 DIAGNOSIS — H35373 Puckering of macula, bilateral: Secondary | ICD-10-CM | POA: Diagnosis not present

## 2022-04-24 ENCOUNTER — Ambulatory Visit: Payer: PPO | Admitting: Dermatology

## 2022-04-24 ENCOUNTER — Encounter: Payer: Self-pay | Admitting: Dermatology

## 2022-04-24 DIAGNOSIS — Z85828 Personal history of other malignant neoplasm of skin: Secondary | ICD-10-CM

## 2022-04-24 DIAGNOSIS — Z125 Encounter for screening for malignant neoplasm of prostate: Secondary | ICD-10-CM | POA: Diagnosis not present

## 2022-04-24 DIAGNOSIS — F320 Major depressive disorder, single episode, mild: Secondary | ICD-10-CM | POA: Diagnosis not present

## 2022-04-24 DIAGNOSIS — Z1211 Encounter for screening for malignant neoplasm of colon: Secondary | ICD-10-CM | POA: Diagnosis not present

## 2022-04-24 DIAGNOSIS — E7800 Pure hypercholesterolemia, unspecified: Secondary | ICD-10-CM | POA: Diagnosis not present

## 2022-04-24 DIAGNOSIS — L570 Actinic keratosis: Secondary | ICD-10-CM | POA: Diagnosis not present

## 2022-04-24 DIAGNOSIS — L814 Other melanin hyperpigmentation: Secondary | ICD-10-CM

## 2022-04-24 DIAGNOSIS — L820 Inflamed seborrheic keratosis: Secondary | ICD-10-CM

## 2022-04-24 DIAGNOSIS — L578 Other skin changes due to chronic exposure to nonionizing radiation: Secondary | ICD-10-CM | POA: Diagnosis not present

## 2022-04-24 DIAGNOSIS — Z79899 Other long term (current) drug therapy: Secondary | ICD-10-CM | POA: Diagnosis not present

## 2022-04-24 DIAGNOSIS — E538 Deficiency of other specified B group vitamins: Secondary | ICD-10-CM | POA: Diagnosis not present

## 2022-04-24 NOTE — Progress Notes (Signed)
Follow-Up Visit   Subjective  Dwayne Banks is a 81 y.o. male who presents for the following: Actinic Keratosis (Face treated with LN2). The patient has spots, moles and lesions to be evaluated, some may be new or changing and the patient has concerns that these could be cancer.  The following portions of the chart were reviewed this encounter and updated as appropriate:   Tobacco  Allergies  Meds  Problems  Med Hx  Surg Hx  Fam Hx     Review of Systems:  No other skin or systemic complaints except as noted in HPI or Assessment and Plan.  Objective  Well appearing patient in no apparent distress; mood and affect are within normal limits.  A focused examination was performed including face, ears. Relevant physical exam findings are noted in the Assessment and Plan.  Face (16) Erythematous thin papules/macules with gritty scale.   Left Hand - Posterior (5) Erythematous stuck-on, waxy papule or plaque   Assessment & Plan   History of Basal Cell Carcinoma of the Skin - No evidence of recurrence today - Recommend regular full body skin exams - Recommend daily broad spectrum sunscreen SPF 30+ to sun-exposed areas, reapply every 2 hours as needed.  - Call if any new or changing lesions are noted between office visits  Lentigines - Scattered tan macules - Due to sun exposure - Benign-appering, observe - Recommend daily broad spectrum sunscreen SPF 30+ to sun-exposed areas, reapply every 2 hours as needed. - Call for any changes  AK (actinic keratosis) (16) Face  Actinic Damage - Severe, confluent actinic changes with pre-cancerous actinic keratoses  - Severe, chronic, not at goal, secondary to cumulative UV radiation exposure over time - diffuse scaly erythematous macules and papules with underlying dyspigmentation - Discussed Prescription "Field Treatment" for Severe, Chronic Confluent Actinic Changes with Pre-Cancerous Actinic Keratoses Field treatment involves  treatment of an entire area of skin that has confluent Actinic Changes (Sun/ Ultraviolet light damage) and PreCancerous Actinic Keratoses by method of PhotoDynamic Therapy (PDT) and/or prescription Topical Chemotherapy agents such as 5-fluorouracil, 5-fluorouracil/calcipotriene, and/or imiquimod.  The purpose is to decrease the number of clinically evident and subclinical PreCancerous lesions to prevent progression to development of skin cancer by chemically destroying early precancer changes that may or may not be visible.  It has been shown to reduce the risk of developing skin cancer in the treated area. As a result of treatment, redness, scaling, crusting, and open sores may occur during treatment course. One or more than one of these methods may be used and may have to be used several times to control, suppress and eliminate the PreCancerous changes. Discussed treatment course, expected reaction, and possible side effects. - Recommend daily broad spectrum sunscreen SPF 30+ to sun-exposed areas, reapply every 2 hours as needed.  - Staying in the shade or wearing long sleeves, sun glasses (UVA+UVB protection) and wide brim hats (4-inch brim around the entire circumference of the hat) are also recommended. - Call for new or changing lesions.  Will plan PDT to face on follow up  Destruction of lesion - Face Complexity: simple   Destruction method: cryotherapy   Informed consent: discussed and consent obtained   Timeout:  patient name, date of birth, surgical site, and procedure verified Lesion destroyed using liquid nitrogen: Yes   Region frozen until ice ball extended beyond lesion: Yes   Outcome: patient tolerated procedure well with no complications   Post-procedure details: wound care instructions given  Inflamed seborrheic keratosis (5) Left Hand - Posterior  Destruction of lesion - Left Hand - Posterior Complexity: simple   Destruction method: cryotherapy   Informed consent: discussed  and consent obtained   Timeout:  patient name, date of birth, surgical site, and procedure verified Lesion destroyed using liquid nitrogen: Yes   Region frozen until ice ball extended beyond lesion: Yes   Outcome: patient tolerated procedure well with no complications   Post-procedure details: wound care instructions given     Return for August 2023 for PDT to face, October with Dr. Nehemiah Massed.  I, Ashok Cordia, CMA, am acting as scribe for Sarina Ser, MD . Documentation: I have reviewed the above documentation for accuracy and completeness, and I agree with the above.  Sarina Ser, MD

## 2022-04-24 NOTE — Patient Instructions (Signed)
Cryotherapy Aftercare  Wash gently with soap and water everyday.   Apply Vaseline and Band-Aid daily until healed.     Due to recent changes in healthcare laws, you may see results of your pathology and/or laboratory studies on MyChart before the doctors have had a chance to review them. We understand that in some cases there may be results that are confusing or concerning to you. Please understand that not all results are received at the same time and often the doctors may need to interpret multiple results in order to provide you with the best plan of care or course of treatment. Therefore, we ask that you please give us 2 business days to thoroughly review all your results before contacting the office for clarification. Should we see a critical lab result, you will be contacted sooner.   If You Need Anything After Your Visit  If you have any questions or concerns for your doctor, please call our main line at [phone] and press option 4 to reach your doctor's medical assistant. If no one answers, please leave a voicemail as directed and we will return your call as soon as possible. Messages left after 4 pm will be answered the following business day.   You may also send us a message via MyChart. We typically respond to MyChart messages within 1-2 business days.  For prescription refills, please ask your pharmacy to contact our office. Our fax number is [phone].  If you have an urgent issue when the clinic is closed that cannot wait until the next business day, you can page your doctor at the number below.    Please note that while we do our best to be available for urgent issues outside of office hours, we are not available 24/7.   If you have an urgent issue and are unable to reach us, you may choose to seek medical care at your doctor's office, retail clinic, urgent care center, or emergency room.  If you have a medical emergency, please immediately call 911 or go to the  emergency department.  Pager Numbers  - Dr. Kowalski: [phone]  - Dr. Moye: [phone]  - Dr. Stewart: [phone]  In the event of inclement weather, please call our main line at [phone] for an update on the status of any delays or closures.  Dermatology Medication Tips: Please keep the boxes that topical medications come in in order to help keep track of the instructions about where and how to use these. Pharmacies typically print the medication instructions only on the boxes and not directly on the medication tubes.   If your medication is too expensive, please contact our office at [phone] option 4 or send us a message through MyChart.   We are unable to tell what your co-pay for medications will be in advance as this is different depending on your insurance coverage. However, we may be able to find a substitute medication at lower cost or fill out paperwork to get insurance to cover a needed medication.   If a prior authorization is required to get your medication covered by your insurance company, please allow us 1-2 business days to complete this process.  Drug prices often vary depending on where the prescription is filled and some pharmacies may offer cheaper prices.  The website www.goodrx.com contains coupons for medications through different pharmacies. The prices here do not account for what the cost may be with help from insurance (it may be cheaper with your insurance), but the website can   give you the price if you did not use any insurance.  - You can print the associated coupon and take it with your prescription to the pharmacy.  - You may also stop by our office during regular business hours and pick up a GoodRx coupon card.  - If you need your prescription sent electronically to a different pharmacy, notify our office through [facility] MyChart or by phone at [phone] option 4.     Si Usted Necesita Algo Despus de Su Visita  Tambin puede  enviarnos un mensaje a travs de MyChart. Por lo general respondemos a los mensajes de MyChart en el transcurso de 1 a 2 das hbiles.  Para renovar recetas, por favor pida a su farmacia que se ponga en contacto con nuestra oficina. Nuestro nmero de fax es el [phone].  Si tiene un asunto urgente cuando la clnica est cerrada y que no puede esperar hasta el siguiente da hbil, puede llamar/localizar a su doctor(a) al nmero que aparece a continuacin.   Por favor, tenga en cuenta que aunque hacemos todo lo posible para estar disponibles para asuntos urgentes fuera del horario de oficina, no estamos disponibles las 24 horas del da, los 7 das de la semana.   Si tiene un problema urgente y no puede comunicarse con nosotros, puede optar por buscar atencin mdica  en el consultorio de su doctor(a), en una clnica privada, en un centro de atencin urgente o en una sala de emergencias.  Si tiene una emergencia mdica, por favor llame inmediatamente al 911 o vaya a la sala de emergencias.  Nmeros de bper  - Dr. Kowalski: [phone]  - Dra. Moye: [phone]  - Dra. Stewart: [phone]  En caso de inclemencias del tiempo, por favor llame a nuestra lnea principal al [phone] para una actualizacin sobre el estado de cualquier retraso o cierre.  Consejos para la medicacin en dermatologa: Por favor, guarde las cajas en las que vienen los medicamentos de uso tpico para ayudarle a seguir las instrucciones sobre dnde y cmo usarlos. Las farmacias generalmente imprimen las instrucciones del medicamento slo en las cajas y no directamente en los tubos del medicamento.   Si su medicamento es muy caro, por favor, pngase en contacto con nuestra oficina llamando al [phone] y presione la opcin 4 o envenos un mensaje a travs de MyChart.   No podemos decirle cul ser su copago por los medicamentos por adelantado ya que esto es diferente dependiendo de la cobertura de su seguro.  Sin embargo, es posible que podamos encontrar un medicamento sustituto a menor costo o llenar un formulario para que el seguro cubra el medicamento que se considera necesario.   Si se requiere una autorizacin previa para que su compaa de seguros cubra su medicamento, por favor permtanos de 1 a 2 das hbiles para completar este proceso.  Los precios de los medicamentos varan con frecuencia dependiendo del lugar de dnde se surte la receta y alguna farmacias pueden ofrecer precios ms baratos.  El sitio web www.goodrx.com tiene cupones para medicamentos de diferentes farmacias. Los precios aqu no tienen en cuenta lo que podra costar con la ayuda del seguro (puede ser ms barato con su seguro), pero el sitio web puede darle el precio si no utiliz ningn seguro.  - Puede imprimir el cupn correspondiente y llevarlo con su receta a la farmacia.  - Tambin puede pasar por nuestra oficina durante el horario de atencin regular y recoger una tarjeta de cupones de GoodRx.  -   Si necesita que su receta se enve electrnicamente a una farmacia diferente, informe a nuestra oficina a travs de MyChart de [facility] o por telfono llamando al [phone] y presione la opcin 4.  

## 2022-04-27 ENCOUNTER — Ambulatory Visit: Payer: PPO | Admitting: Dermatology

## 2022-04-28 DIAGNOSIS — Z1211 Encounter for screening for malignant neoplasm of colon: Secondary | ICD-10-CM | POA: Diagnosis not present

## 2022-06-14 ENCOUNTER — Ambulatory Visit: Payer: PPO | Admitting: Dermatology

## 2022-06-14 DIAGNOSIS — H35373 Puckering of macula, bilateral: Secondary | ICD-10-CM | POA: Diagnosis not present

## 2022-06-14 DIAGNOSIS — H353111 Nonexudative age-related macular degeneration, right eye, early dry stage: Secondary | ICD-10-CM | POA: Diagnosis not present

## 2022-06-14 DIAGNOSIS — H43813 Vitreous degeneration, bilateral: Secondary | ICD-10-CM | POA: Diagnosis not present

## 2022-06-14 DIAGNOSIS — H353221 Exudative age-related macular degeneration, left eye, with active choroidal neovascularization: Secondary | ICD-10-CM | POA: Diagnosis not present

## 2022-06-15 ENCOUNTER — Ambulatory Visit: Payer: PPO | Admitting: Dermatology

## 2022-06-15 DIAGNOSIS — L570 Actinic keratosis: Secondary | ICD-10-CM

## 2022-06-15 MED ORDER — AMINOLEVULINIC ACID HCL 20 % EX SOLR
1.0000 | Freq: Once | CUTANEOUS | Status: AC
  Administered 2022-06-15: 354 mg via TOPICAL

## 2022-06-15 NOTE — Progress Notes (Signed)
Patient completed PDT therapy today.  1. AK (actinic keratosis) Head - Anterior (Face)  Photodynamic therapy - Head - Anterior (Face) Procedure discussed: discussed risks, benefits, side effects. and alternatives   Prep: site scrubbed/prepped with acetone   Location:  Face Number of lesions:  Multiple Type of treatment:  Blue light Aminolevulinic Acid (see MAR for details): Levulan Number of Levulan sticks used:  1 Incubation time (minutes):  60 Number of minutes under lamp:  16 Number of seconds under lamp:  40 Cooling:  Floor fan Outcome: patient tolerated procedure well with no complications   Post-procedure details: sunscreen applied and aftercare instructions given to patient    Related Medications Aminolevulinic Acid HCl 20 % SOLR 354 mg   Abby Foster RMA Documentation: I have reviewed the above documentation for accuracy and completeness, and I agree with the above.  Sarina Ser, MD

## 2022-06-15 NOTE — Patient Instructions (Signed)

## 2022-06-19 ENCOUNTER — Encounter: Payer: Self-pay | Admitting: Dermatology

## 2022-08-22 ENCOUNTER — Ambulatory Visit: Payer: PPO | Admitting: Dermatology

## 2022-08-22 DIAGNOSIS — L570 Actinic keratosis: Secondary | ICD-10-CM | POA: Diagnosis not present

## 2022-08-22 DIAGNOSIS — Z5111 Encounter for antineoplastic chemotherapy: Secondary | ICD-10-CM | POA: Diagnosis not present

## 2022-08-22 DIAGNOSIS — Z79899 Other long term (current) drug therapy: Secondary | ICD-10-CM

## 2022-08-22 DIAGNOSIS — L578 Other skin changes due to chronic exposure to nonionizing radiation: Secondary | ICD-10-CM | POA: Diagnosis not present

## 2022-08-22 NOTE — Patient Instructions (Addendum)
Cryotherapy Aftercare  Wash gently with soap and water everyday.   Apply Vaseline and Band-Aid daily until healed.     Due to recent changes in healthcare laws, you may see results of your pathology and/or laboratory studies on MyChart before the doctors have had a chance to review them. We understand that in some cases there may be results that are confusing or concerning to you. Please understand that not all results are received at the same time and often the doctors may need to interpret multiple results in order to provide you with the best plan of care or course of treatment. Therefore, we ask that you please give us 2 business days to thoroughly review all your results before contacting the office for clarification. Should we see a critical lab result, you will be contacted sooner.   If You Need Anything After Your Visit  If you have any questions or concerns for your doctor, please call our main line at [phone] and press option 4 to reach your doctor's medical assistant. If no one answers, please leave a voicemail as directed and we will return your call as soon as possible. Messages left after 4 pm will be answered the following business day.   You may also send us a message via MyChart. We typically respond to MyChart messages within 1-2 business days.  For prescription refills, please ask your pharmacy to contact our office. Our fax number is [phone].  If you have an urgent issue when the clinic is closed that cannot wait until the next business day, you can page your doctor at the number below.    Please note that while we do our best to be available for urgent issues outside of office hours, we are not available 24/7.   If you have an urgent issue and are unable to reach us, you may choose to seek medical care at your doctor's office, retail clinic, urgent care center, or emergency room.  If you have a medical emergency, please immediately call 911 or go to the  emergency department.  Pager Numbers  - Dr. Kowalski: [phone]  - Dr. Moye: [phone]  - Dr. Stewart: [phone]  In the event of inclement weather, please call our main line at [phone] for an update on the status of any delays or closures.  Dermatology Medication Tips: Please keep the boxes that topical medications come in in order to help keep track of the instructions about where and how to use these. Pharmacies typically print the medication instructions only on the boxes and not directly on the medication tubes.   If your medication is too expensive, please contact our office at [phone] option 4 or send us a message through MyChart.   We are unable to tell what your co-pay for medications will be in advance as this is different depending on your insurance coverage. However, we may be able to find a substitute medication at lower cost or fill out paperwork to get insurance to cover a needed medication.   If a prior authorization is required to get your medication covered by your insurance company, please allow us 1-2 business days to complete this process.  Drug prices often vary depending on where the prescription is filled and some pharmacies may offer cheaper prices.  The website www.goodrx.com contains coupons for medications through different pharmacies. The prices here do not account for what the cost may be with help from insurance (it may be cheaper with your insurance), but the website can   give you the price if you did not use any insurance.  - You can print the associated coupon and take it with your prescription to the pharmacy.  - You may also stop by our office during regular business hours and pick up a GoodRx coupon card.  - If you need your prescription sent electronically to a different pharmacy, notify our office through  MyChart or by phone at [phone] option 4.     Si Usted Necesita Algo Despus de Su Visita  Tambin puede  enviarnos un mensaje a travs de MyChart. Por lo general respondemos a los mensajes de MyChart en el transcurso de 1 a 2 das hbiles.  Para renovar recetas, por favor pida a su farmacia que se ponga en contacto con nuestra oficina. Nuestro nmero de fax es el [phone].  Si tiene un asunto urgente cuando la clnica est cerrada y que no puede esperar hasta el siguiente da hbil, puede llamar/localizar a su doctor(a) al nmero que aparece a continuacin.   Por favor, tenga en cuenta que aunque hacemos todo lo posible para estar disponibles para asuntos urgentes fuera del horario de oficina, no estamos disponibles las 24 horas del da, los 7 das de la semana.   Si tiene un problema urgente y no puede comunicarse con nosotros, puede optar por buscar atencin mdica  en el consultorio de su doctor(a), en una clnica privada, en un centro de atencin urgente o en una sala de emergencias.  Si tiene una emergencia mdica, por favor llame inmediatamente al 911 o vaya a la sala de emergencias.  Nmeros de bper  - Dr. Kowalski: [phone]  - Dra. Moye: [phone]  - Dra. Stewart: [phone]  En caso de inclemencias del tiempo, por favor llame a nuestra lnea principal al [phone] para una actualizacin sobre el estado de cualquier retraso o cierre.  Consejos para la medicacin en dermatologa: Por favor, guarde las cajas en las que vienen los medicamentos de uso tpico para ayudarle a seguir las instrucciones sobre dnde y cmo usarlos. Las farmacias generalmente imprimen las instrucciones del medicamento slo en las cajas y no directamente en los tubos del medicamento.   Si su medicamento es muy caro, por favor, pngase en contacto con nuestra oficina llamando al [phone] y presione la opcin 4 o envenos un mensaje a travs de MyChart.   No podemos decirle cul ser su copago por los medicamentos por adelantado ya que esto es diferente dependiendo de la cobertura de su seguro.  Sin embargo, es posible que podamos encontrar un medicamento sustituto a menor costo o llenar un formulario para que el seguro cubra el medicamento que se considera necesario.   Si se requiere una autorizacin previa para que su compaa de seguros cubra su medicamento, por favor permtanos de 1 a 2 das hbiles para completar este proceso.  Los precios de los medicamentos varan con frecuencia dependiendo del lugar de dnde se surte la receta y alguna farmacias pueden ofrecer precios ms baratos.  El sitio web www.goodrx.com tiene cupones para medicamentos de diferentes farmacias. Los precios aqu no tienen en cuenta lo que podra costar con la ayuda del seguro (puede ser ms barato con su seguro), pero el sitio web puede darle el precio si no utiliz ningn seguro.  - Puede imprimir el cupn correspondiente y llevarlo con su receta a la farmacia.  - Tambin puede pasar por nuestra oficina durante el horario de atencin regular y recoger una tarjeta de cupones de GoodRx.  -   Si necesita que su receta se enve electrnicamente a una farmacia diferente, informe a nuestra oficina a travs de MyChart de [facility] o por telfono llamando al [phone] y presione la opcin 4.  

## 2022-08-22 NOTE — Progress Notes (Signed)
Follow-Up Visit   Subjective  Dwayne Banks is a 81 y.o. male who presents for the following: Follow-up (2 months f/u [facility]- treated with PDT on his face 2 months ago, patient report he still have several crusty places on his face.).  The following portions of the chart were reviewed this encounter and updated as appropriate:   Tobacco  Allergies  Meds  Problems  Med Hx  Surg Hx  Fam Hx     Review of Systems:  No other skin or systemic complaints except as noted in HPI or Assessment and Plan.  Objective  Well appearing patient in no apparent distress; mood and affect are within normal limits.  A focused examination was performed including face. Relevant physical exam findings are noted in the Assessment and Plan.  face x 16 (16) Erythematous thin papules/macules with gritty scale.    Assessment & Plan  AK (actinic keratosis) (16) face x 16  Actinic keratoses are precancerous spots that appear secondary to cumulative UV radiation exposure/sun exposure over time. They are chronic with expected duration over 1 year. A portion of actinic keratoses will progress to squamous cell carcinoma of the skin. It is not possible to reliably predict which spots will progress to skin cancer and so treatment is recommended to prevent development of skin cancer.  Recommend daily broad spectrum sunscreen SPF 30+ to sun-exposed areas, reapply every 2 hours as needed.  Recommend staying in the shade or wearing long sleeves, sun glasses (UVA+UVB protection) and wide brim hats (4-inch brim around the entire circumference of the hat). Call for new or changing lesions.   Destruction of lesion - face x 16 Complexity: simple   Destruction method: cryotherapy   Informed consent: discussed and consent obtained   Timeout:  patient name, date of birth, surgical site, and procedure verified Lesion destroyed using liquid nitrogen: Yes   Region frozen until ice ball extended beyond lesion: Yes   Outcome:  patient tolerated procedure well with no complications   Post-procedure details: wound care instructions given    Actinic Damage - Severe, confluent actinic changes with pre-cancerous actinic keratoses  - Severe, chronic, not at goal, secondary to cumulative UV radiation exposure over time - diffuse scaly erythematous macules and papules with underlying dyspigmentation - Discussed Prescription "Field Treatment" for Severe, Chronic Confluent Actinic Changes with Pre-Cancerous Actinic Keratoses  Recommend red light in 2-6 weeks on his face   Field treatment involves treatment of an entire area of skin that has confluent Actinic Changes (Sun/ Ultraviolet light damage) and PreCancerous Actinic Keratoses by method of PhotoDynamic Therapy (PDT) and/or prescription Topical Chemotherapy agents such as 5-fluorouracil, 5-fluorouracil/calcipotriene, and/or imiquimod.  The purpose is to decrease the number of clinically evident and subclinical PreCancerous lesions to prevent progression to development of skin cancer by chemically destroying early precancer changes that may or may not be visible.  It has been shown to reduce the risk of developing skin cancer in the treated area. As a result of treatment, redness, scaling, crusting, and open sores may occur during treatment course. One or more than one of these methods may be used and may have to be used several times to control, suppress and eliminate the PreCancerous changes. Discussed treatment course, expected reaction, and possible side effects. - Recommend daily broad spectrum sunscreen SPF 30+ to sun-exposed areas, reapply every 2 hours as needed.  - Staying in the shade or wearing long sleeves, sun glasses (UVA+UVB protection) and wide brim hats (4-inch brim around  the entire circumference of the hat) are also recommended. - Call for new or changing lesions.   Return in about 6 weeks (around 10/03/2022) for AKs face-Red light .  IMarye Round, CMA,  am acting as scribe for Sarina Ser, MD .  Documentation: I have reviewed the above documentation for accuracy and completeness, and I agree with the above.  Sarina Ser, MD

## 2022-08-23 DIAGNOSIS — H353111 Nonexudative age-related macular degeneration, right eye, early dry stage: Secondary | ICD-10-CM | POA: Diagnosis not present

## 2022-08-23 DIAGNOSIS — H35373 Puckering of macula, bilateral: Secondary | ICD-10-CM | POA: Diagnosis not present

## 2022-08-23 DIAGNOSIS — H43813 Vitreous degeneration, bilateral: Secondary | ICD-10-CM | POA: Diagnosis not present

## 2022-08-23 DIAGNOSIS — H353221 Exudative age-related macular degeneration, left eye, with active choroidal neovascularization: Secondary | ICD-10-CM | POA: Diagnosis not present

## 2022-08-25 DIAGNOSIS — Z79899 Other long term (current) drug therapy: Secondary | ICD-10-CM | POA: Diagnosis not present

## 2022-08-25 DIAGNOSIS — F320 Major depressive disorder, single episode, mild: Secondary | ICD-10-CM | POA: Diagnosis not present

## 2022-08-25 DIAGNOSIS — E7800 Pure hypercholesterolemia, unspecified: Secondary | ICD-10-CM | POA: Diagnosis not present

## 2022-08-25 DIAGNOSIS — E538 Deficiency of other specified B group vitamins: Secondary | ICD-10-CM | POA: Diagnosis not present

## 2022-08-29 ENCOUNTER — Encounter: Payer: Self-pay | Admitting: Dermatology

## 2022-10-10 ENCOUNTER — Ambulatory Visit: Payer: PPO | Admitting: Dermatology

## 2022-10-10 DIAGNOSIS — L570 Actinic keratosis: Secondary | ICD-10-CM | POA: Diagnosis not present

## 2022-10-10 MED ORDER — AMINOLEVULINIC ACID HCL 10 % EX GEL
2000.0000 mg | Freq: Once | CUTANEOUS | Status: AC
  Administered 2022-10-10: 2000 mg via TOPICAL

## 2022-10-10 NOTE — Progress Notes (Unsigned)
Patient completed red light therapy today.  1. AK (actinic keratosis) Head - Anterior (Face)  Photodynamic therapy - Head - Anterior (Face) Procedure discussed: discussed risks, benefits, side effects. and alternatives   Prep: site scrubbed/prepped with acetone   Location:  Face Number of lesions:  Multiple Type of treatment:  Red light Aminolevulinic Acid (see MAR for details): Ameluz Amount of Ameluz (mg):  1 Incubation time (minutes):  60 Number of minutes under lamp:  16 Number of seconds under lamp:  40 Cooling:  Floor fan Outcome: patient tolerated procedure well with no complications   Post-procedure details: sunscreen applied    Johnsie Kindred, RMA Documentation: I have reviewed the above documentation for accuracy and completeness, and I agree with the above.  Sarina Ser, MD

## 2022-10-10 NOTE — Patient Instructions (Addendum)

## 2022-10-11 DIAGNOSIS — H6123 Impacted cerumen, bilateral: Secondary | ICD-10-CM | POA: Diagnosis not present

## 2022-10-11 DIAGNOSIS — H903 Sensorineural hearing loss, bilateral: Secondary | ICD-10-CM | POA: Diagnosis not present

## 2022-10-24 ENCOUNTER — Encounter: Payer: Self-pay | Admitting: Dermatology

## 2022-10-25 DIAGNOSIS — H353111 Nonexudative age-related macular degeneration, right eye, early dry stage: Secondary | ICD-10-CM | POA: Diagnosis not present

## 2022-10-25 DIAGNOSIS — H43813 Vitreous degeneration, bilateral: Secondary | ICD-10-CM | POA: Diagnosis not present

## 2022-10-25 DIAGNOSIS — H353220 Exudative age-related macular degeneration, left eye, stage unspecified: Secondary | ICD-10-CM | POA: Diagnosis not present

## 2022-10-25 DIAGNOSIS — H35373 Puckering of macula, bilateral: Secondary | ICD-10-CM | POA: Diagnosis not present

## 2022-11-29 DIAGNOSIS — H35373 Puckering of macula, bilateral: Secondary | ICD-10-CM | POA: Diagnosis not present

## 2022-11-29 DIAGNOSIS — H43813 Vitreous degeneration, bilateral: Secondary | ICD-10-CM | POA: Diagnosis not present

## 2022-11-29 DIAGNOSIS — H43393 Other vitreous opacities, bilateral: Secondary | ICD-10-CM | POA: Diagnosis not present

## 2022-11-29 DIAGNOSIS — H353221 Exudative age-related macular degeneration, left eye, with active choroidal neovascularization: Secondary | ICD-10-CM | POA: Diagnosis not present

## 2022-12-20 ENCOUNTER — Other Ambulatory Visit: Payer: Self-pay | Admitting: Internal Medicine

## 2022-12-20 DIAGNOSIS — R42 Dizziness and giddiness: Secondary | ICD-10-CM | POA: Diagnosis not present

## 2022-12-20 DIAGNOSIS — R8290 Unspecified abnormal findings in urine: Secondary | ICD-10-CM | POA: Diagnosis not present

## 2022-12-20 DIAGNOSIS — Z79899 Other long term (current) drug therapy: Secondary | ICD-10-CM | POA: Diagnosis not present

## 2022-12-25 ENCOUNTER — Ambulatory Visit: Payer: PPO | Admitting: Dermatology

## 2022-12-26 ENCOUNTER — Ambulatory Visit
Admission: RE | Admit: 2022-12-26 | Discharge: 2022-12-26 | Disposition: A | Discharge status: Home / Self Care | Payer: PPO | Source: Ambulatory Visit | Attending: Internal Medicine | Admitting: Internal Medicine

## 2022-12-26 DIAGNOSIS — R42 Dizziness and giddiness: Secondary | ICD-10-CM | POA: Insufficient documentation

## 2022-12-27 DIAGNOSIS — I6523 Occlusion and stenosis of bilateral carotid arteries: Secondary | ICD-10-CM | POA: Diagnosis not present

## 2022-12-27 DIAGNOSIS — E785 Hyperlipidemia, unspecified: Secondary | ICD-10-CM | POA: Diagnosis not present

## 2022-12-27 DIAGNOSIS — R42 Dizziness and giddiness: Secondary | ICD-10-CM | POA: Diagnosis not present

## 2023-01-01 ENCOUNTER — Ambulatory Visit: Payer: PPO

## 2023-01-01 DIAGNOSIS — E538 Deficiency of other specified B group vitamins: Secondary | ICD-10-CM | POA: Diagnosis not present

## 2023-01-01 DIAGNOSIS — Z0000 Encounter for general adult medical examination without abnormal findings: Secondary | ICD-10-CM | POA: Diagnosis not present

## 2023-01-01 DIAGNOSIS — Z125 Encounter for screening for malignant neoplasm of prostate: Secondary | ICD-10-CM | POA: Diagnosis not present

## 2023-01-01 DIAGNOSIS — Z79899 Other long term (current) drug therapy: Secondary | ICD-10-CM | POA: Diagnosis not present

## 2023-01-01 DIAGNOSIS — E782 Mixed hyperlipidemia: Secondary | ICD-10-CM | POA: Diagnosis not present

## 2023-01-01 DIAGNOSIS — F320 Major depressive disorder, single episode, mild: Secondary | ICD-10-CM | POA: Diagnosis not present

## 2023-01-03 DIAGNOSIS — H43813 Vitreous degeneration, bilateral: Secondary | ICD-10-CM | POA: Diagnosis not present

## 2023-01-03 DIAGNOSIS — H35373 Puckering of macula, bilateral: Secondary | ICD-10-CM | POA: Diagnosis not present

## 2023-01-03 DIAGNOSIS — H353221 Exudative age-related macular degeneration, left eye, with active choroidal neovascularization: Secondary | ICD-10-CM | POA: Diagnosis not present

## 2023-01-03 DIAGNOSIS — H353111 Nonexudative age-related macular degeneration, right eye, early dry stage: Secondary | ICD-10-CM | POA: Diagnosis not present

## 2023-01-03 DIAGNOSIS — H43393 Other vitreous opacities, bilateral: Secondary | ICD-10-CM | POA: Diagnosis not present

## 2023-02-13 DIAGNOSIS — H353221 Exudative age-related macular degeneration, left eye, with active choroidal neovascularization: Secondary | ICD-10-CM | POA: Diagnosis not present

## 2023-02-13 DIAGNOSIS — H353111 Nonexudative age-related macular degeneration, right eye, early dry stage: Secondary | ICD-10-CM | POA: Diagnosis not present

## 2023-02-13 DIAGNOSIS — H43813 Vitreous degeneration, bilateral: Secondary | ICD-10-CM | POA: Diagnosis not present

## 2023-02-13 DIAGNOSIS — H35373 Puckering of macula, bilateral: Secondary | ICD-10-CM | POA: Diagnosis not present

## 2023-02-13 DIAGNOSIS — H43393 Other vitreous opacities, bilateral: Secondary | ICD-10-CM | POA: Diagnosis not present

## 2023-02-28 ENCOUNTER — Ambulatory Visit (INDEPENDENT_AMBULATORY_CARE_PROVIDER_SITE_OTHER): Payer: PPO | Admitting: Dermatology

## 2023-02-28 VITALS — BP 140/60

## 2023-02-28 DIAGNOSIS — L570 Actinic keratosis: Secondary | ICD-10-CM | POA: Diagnosis not present

## 2023-02-28 DIAGNOSIS — L578 Other skin changes due to chronic exposure to nonionizing radiation: Secondary | ICD-10-CM | POA: Diagnosis not present

## 2023-02-28 DIAGNOSIS — L820 Inflamed seborrheic keratosis: Secondary | ICD-10-CM

## 2023-02-28 DIAGNOSIS — Z85828 Personal history of other malignant neoplasm of skin: Secondary | ICD-10-CM | POA: Diagnosis not present

## 2023-02-28 NOTE — Progress Notes (Signed)
Follow-Up Visit   Subjective  Dwayne Banks is a 82 y.o. male who presents for the following: Actinic keratosis, Red light txt 10/11/23, check spot L lower leg, ~16m, none healing The patient has spots, moles and lesions to be evaluated, some may be new or changing and the patient may have concern these could be cancer.  The following portions of the chart were reviewed this encounter and updated as appropriate: medications, allergies, medical history  Review of Systems:  No other skin or systemic complaints except as noted in HPI or Assessment and Plan.  Objective  Well appearing patient in no apparent distress; mood and affect are within normal limits. A focused examination was performed of the following areas: Face, ears, scalp, left lower leg Relevant exam findings are noted in the Assessment and Plan.   Assessment & Plan   Actinic keratosis  Inflamed seborrheic keratosis  Actinic skin damage   ACTINIC KERATOSIS  Exam: Erythematous thin papules/macules with gritty scale  Actinic keratoses are precancerous spots that appear secondary to cumulative UV radiation exposure/sun exposure over time. They are chronic with expected duration over 1 year. A portion of actinic keratoses will progress to squamous cell carcinoma of the skin. It is not possible to reliably predict which spots will progress to skin cancer and so treatment is recommended to prevent development of skin cancer.  Recommend daily broad spectrum sunscreen SPF 30+ to sun-exposed areas, reapply every 2 hours as needed.  Recommend staying in the shade or wearing long sleeves, sun glasses (UVA+UVB protection) and wide brim hats (4-inch brim around the entire circumference of the hat). Call for new or changing lesions.  Treatment Plan: Destruction Procedure Note Destruction method: cryotherapy   Informed consent: discussed and consent obtained   Lesion destroyed using liquid nitrogen: Yes   Outcome: patient  tolerated procedure well with no complications   Post-procedure details: wound care instructions given   Locations: face # of Lesions Treated: 23  Prior to procedure, discussed risks of blister formation, small wound, skin dyspigmentation, or rare scar following cryotherapy. Recommend Vaseline ointment to treated areas while healing.   INFLAMED SEBORRHEIC KERATOSIS VS POROKERATISIS Exam: Erythematous keratotic or waxy stuck-on papule or plaque.  Symptomatic, irritating, patient would like treated.  Benign-appearing.  Call clinic for new or changing lesions.   Prior to procedure, discussed risks of blister formation, small wound, skin dyspigmentation, or rare scar following treatment. Recommend Vaseline ointment to treated areas while healing.  Destruction Procedure Note Destruction method: cryotherapy   Informed consent: discussed and consent obtained   Lesion destroyed using liquid nitrogen: Yes   Outcome: patient tolerated procedure well with no complications   Post-procedure details: wound care instructions given   Locations: L middle medial calf # of Lesions Treated: 1  ACTINIC DAMAGE - chronic, secondary to cumulative UV radiation exposure/sun exposure over time - diffuse scaly erythematous macules with underlying dyspigmentation - Recommend daily broad spectrum sunscreen SPF 30+ to sun-exposed areas, reapply every 2 hours as needed.  - Recommend staying in the shade or wearing long sleeves, sun glasses (UVA+UVB protection) and wide brim hats (4-inch brim around the entire circumference of the hat). - Call for new or changing lesions.   HISTORY OF BASAL CELL CARCINOMA OF THE SKIN - multiple - No evidence of recurrence today - Recommend regular full body skin exams - Recommend daily broad spectrum sunscreen SPF 30+ to sun-exposed areas, reapply every 2 hours as needed.  - Call if any new or  changing lesions are noted between office visits  Return in about 4 months (around  06/30/2023) for UBSE, Hx of BCC, Hx of SCC, Hx of Dysplastic nevi, Hx of AKs.  I, Ardis Rowan, RMA, am acting as scribe for Armida Sans, MD .  Documentation: I have reviewed the above documentation for accuracy and completeness, and I agree with the above.  Armida Sans, MD

## 2023-02-28 NOTE — Patient Instructions (Addendum)
Cryotherapy Aftercare  Wash gently with soap and water everyday.   Apply Vaseline and Band-Aid daily until healed.     Due to recent changes in healthcare laws, you may see results of your pathology and/or laboratory studies on MyChart before the doctors have had a chance to review them. We understand that in some cases there may be results that are confusing or concerning to you. Please understand that not all results are received at the same time and often the doctors may need to interpret multiple results in order to provide you with the best plan of care or course of treatment. Therefore, we ask that you please give us 2 business days to thoroughly review all your results before contacting the office for clarification. Should we see a critical lab result, you will be contacted sooner.   If You Need Anything After Your Visit  If you have any questions or concerns for your doctor, please call our main line at [phone] and press option 4 to reach your doctor's medical assistant. If no one answers, please leave a voicemail as directed and we will return your call as soon as possible. Messages left after 4 pm will be answered the following business day.   You may also send us a message via MyChart. We typically respond to MyChart messages within 1-2 business days.  For prescription refills, please ask your pharmacy to contact our office. Our fax number is [phone].  If you have an urgent issue when the clinic is closed that cannot wait until the next business day, you can page your doctor at the number below.    Please note that while we do our best to be available for urgent issues outside of office hours, we are not available 24/7.   If you have an urgent issue and are unable to reach us, you may choose to seek medical care at your doctor's office, retail clinic, urgent care center, or emergency room.  If you have a medical emergency, please immediately call 911 or go to the  emergency department.  Pager Numbers  - Dr. Kowalski: [phone]  - Dr. Moye: [phone]  - Dr. Stewart: [phone]  In the event of inclement weather, please call our main line at [phone] for an update on the status of any delays or closures.  Dermatology Medication Tips: Please keep the boxes that topical medications come in in order to help keep track of the instructions about where and how to use these. Pharmacies typically print the medication instructions only on the boxes and not directly on the medication tubes.   If your medication is too expensive, please contact our office at [phone] option 4 or send us a message through MyChart.   We are unable to tell what your co-pay for medications will be in advance as this is different depending on your insurance coverage. However, we may be able to find a substitute medication at lower cost or fill out paperwork to get insurance to cover a needed medication.   If a prior authorization is required to get your medication covered by your insurance company, please allow us 1-2 business days to complete this process.  Drug prices often vary depending on where the prescription is filled and some pharmacies may offer cheaper prices.  The website www.goodrx.com contains coupons for medications through different pharmacies. The prices here do not account for what the cost may be with help from insurance (it may be cheaper with your insurance), but the website can   give you the price if you did not use any insurance.  - You can print the associated coupon and take it with your prescription to the pharmacy.  - You may also stop by our office during regular business hours and pick up a GoodRx coupon card.  - If you need your prescription sent electronically to a different pharmacy, notify our office through [facility] MyChart or by phone at [phone] option 4.     Si Usted Necesita Algo Despus de Su Visita  Tambin puede  enviarnos un mensaje a travs de MyChart. Por lo general respondemos a los mensajes de MyChart en el transcurso de 1 a 2 das hbiles.  Para renovar recetas, por favor pida a su farmacia que se ponga en contacto con nuestra oficina. Nuestro nmero de fax es el [phone].  Si tiene un asunto urgente cuando la clnica est cerrada y que no puede esperar hasta el siguiente da hbil, puede llamar/localizar a su doctor(a) al nmero que aparece a continuacin.   Por favor, tenga en cuenta que aunque hacemos todo lo posible para estar disponibles para asuntos urgentes fuera del horario de oficina, no estamos disponibles las 24 horas del da, los 7 das de la semana.   Si tiene un problema urgente y no puede comunicarse con nosotros, puede optar por buscar atencin mdica  en el consultorio de su doctor(a), en una clnica privada, en un centro de atencin urgente o en una sala de emergencias.  Si tiene una emergencia mdica, por favor llame inmediatamente al 911 o vaya a la sala de emergencias.  Nmeros de bper  - Dr. Kowalski: [phone]  - Dra. Moye: [phone]  - Dra. Stewart: [phone]  En caso de inclemencias del tiempo, por favor llame a nuestra lnea principal al [phone] para una actualizacin sobre el estado de cualquier retraso o cierre.  Consejos para la medicacin en dermatologa: Por favor, guarde las cajas en las que vienen los medicamentos de uso tpico para ayudarle a seguir las instrucciones sobre dnde y cmo usarlos. Las farmacias generalmente imprimen las instrucciones del medicamento slo en las cajas y no directamente en los tubos del medicamento.   Si su medicamento es muy caro, por favor, pngase en contacto con nuestra oficina llamando al [phone] y presione la opcin 4 o envenos un mensaje a travs de MyChart.   No podemos decirle cul ser su copago por los medicamentos por adelantado ya que esto es diferente dependiendo de la cobertura de su seguro.  Sin embargo, es posible que podamos encontrar un medicamento sustituto a menor costo o llenar un formulario para que el seguro cubra el medicamento que se considera necesario.   Si se requiere una autorizacin previa para que su compaa de seguros cubra su medicamento, por favor permtanos de 1 a 2 das hbiles para completar este proceso.  Los precios de los medicamentos varan con frecuencia dependiendo del lugar de dnde se surte la receta y alguna farmacias pueden ofrecer precios ms baratos.  El sitio web www.goodrx.com tiene cupones para medicamentos de diferentes farmacias. Los precios aqu no tienen en cuenta lo que podra costar con la ayuda del seguro (puede ser ms barato con su seguro), pero el sitio web puede darle el precio si no utiliz ningn seguro.  - Puede imprimir el cupn correspondiente y llevarlo con su receta a la farmacia.  - Tambin puede pasar por nuestra oficina durante el horario de atencin regular y recoger una tarjeta de cupones de GoodRx.  -   Si necesita que su receta se enve electrnicamente a una farmacia diferente, informe a nuestra oficina a travs de MyChart de [facility] o por telfono llamando al [phone] y presione la opcin 4.  

## 2023-03-12 ENCOUNTER — Encounter: Payer: Self-pay | Admitting: Dermatology

## 2023-04-03 DIAGNOSIS — H353221 Exudative age-related macular degeneration, left eye, with active choroidal neovascularization: Secondary | ICD-10-CM | POA: Diagnosis not present

## 2023-04-03 DIAGNOSIS — H353111 Nonexudative age-related macular degeneration, right eye, early dry stage: Secondary | ICD-10-CM | POA: Diagnosis not present

## 2023-04-03 DIAGNOSIS — H35373 Puckering of macula, bilateral: Secondary | ICD-10-CM | POA: Diagnosis not present

## 2023-04-03 DIAGNOSIS — H43813 Vitreous degeneration, bilateral: Secondary | ICD-10-CM | POA: Diagnosis not present

## 2023-04-03 DIAGNOSIS — H43393 Other vitreous opacities, bilateral: Secondary | ICD-10-CM | POA: Diagnosis not present

## 2023-04-25 DIAGNOSIS — H353221 Exudative age-related macular degeneration, left eye, with active choroidal neovascularization: Secondary | ICD-10-CM | POA: Diagnosis not present

## 2023-05-01 DIAGNOSIS — E538 Deficiency of other specified B group vitamins: Secondary | ICD-10-CM | POA: Diagnosis not present

## 2023-05-01 DIAGNOSIS — E7800 Pure hypercholesterolemia, unspecified: Secondary | ICD-10-CM | POA: Diagnosis not present

## 2023-05-01 DIAGNOSIS — Z79899 Other long term (current) drug therapy: Secondary | ICD-10-CM | POA: Diagnosis not present

## 2023-05-01 DIAGNOSIS — F320 Major depressive disorder, single episode, mild: Secondary | ICD-10-CM | POA: Diagnosis not present

## 2023-05-01 DIAGNOSIS — Z125 Encounter for screening for malignant neoplasm of prostate: Secondary | ICD-10-CM | POA: Diagnosis not present

## 2023-05-22 DIAGNOSIS — H35373 Puckering of macula, bilateral: Secondary | ICD-10-CM | POA: Diagnosis not present

## 2023-05-22 DIAGNOSIS — H43813 Vitreous degeneration, bilateral: Secondary | ICD-10-CM | POA: Diagnosis not present

## 2023-05-22 DIAGNOSIS — H43393 Other vitreous opacities, bilateral: Secondary | ICD-10-CM | POA: Diagnosis not present

## 2023-05-22 DIAGNOSIS — H353221 Exudative age-related macular degeneration, left eye, with active choroidal neovascularization: Secondary | ICD-10-CM | POA: Diagnosis not present

## 2023-05-22 DIAGNOSIS — H353111 Nonexudative age-related macular degeneration, right eye, early dry stage: Secondary | ICD-10-CM | POA: Diagnosis not present

## 2023-06-12 DIAGNOSIS — H353221 Exudative age-related macular degeneration, left eye, with active choroidal neovascularization: Secondary | ICD-10-CM | POA: Diagnosis not present

## 2023-07-09 DIAGNOSIS — E7800 Pure hypercholesterolemia, unspecified: Secondary | ICD-10-CM | POA: Diagnosis not present

## 2023-07-17 DIAGNOSIS — H43813 Vitreous degeneration, bilateral: Secondary | ICD-10-CM | POA: Diagnosis not present

## 2023-07-17 DIAGNOSIS — H35373 Puckering of macula, bilateral: Secondary | ICD-10-CM | POA: Diagnosis not present

## 2023-07-17 DIAGNOSIS — H353221 Exudative age-related macular degeneration, left eye, with active choroidal neovascularization: Secondary | ICD-10-CM | POA: Diagnosis not present

## 2023-07-17 DIAGNOSIS — H43393 Other vitreous opacities, bilateral: Secondary | ICD-10-CM | POA: Diagnosis not present

## 2023-07-17 DIAGNOSIS — H353111 Nonexudative age-related macular degeneration, right eye, early dry stage: Secondary | ICD-10-CM | POA: Diagnosis not present

## 2023-07-25 ENCOUNTER — Ambulatory Visit: Payer: PPO | Admitting: Dermatology

## 2023-08-15 DIAGNOSIS — H353221 Exudative age-related macular degeneration, left eye, with active choroidal neovascularization: Secondary | ICD-10-CM | POA: Diagnosis not present

## 2023-09-14 DIAGNOSIS — H43813 Vitreous degeneration, bilateral: Secondary | ICD-10-CM | POA: Diagnosis not present

## 2023-09-14 DIAGNOSIS — H35373 Puckering of macula, bilateral: Secondary | ICD-10-CM | POA: Diagnosis not present

## 2023-09-14 DIAGNOSIS — H353111 Nonexudative age-related macular degeneration, right eye, early dry stage: Secondary | ICD-10-CM | POA: Diagnosis not present

## 2023-09-14 DIAGNOSIS — H353221 Exudative age-related macular degeneration, left eye, with active choroidal neovascularization: Secondary | ICD-10-CM | POA: Diagnosis not present

## 2023-09-14 DIAGNOSIS — H43393 Other vitreous opacities, bilateral: Secondary | ICD-10-CM | POA: Diagnosis not present

## 2023-09-25 DIAGNOSIS — R7989 Other specified abnormal findings of blood chemistry: Secondary | ICD-10-CM | POA: Diagnosis not present

## 2023-09-25 DIAGNOSIS — E7800 Pure hypercholesterolemia, unspecified: Secondary | ICD-10-CM | POA: Diagnosis not present

## 2023-09-25 DIAGNOSIS — E538 Deficiency of other specified B group vitamins: Secondary | ICD-10-CM | POA: Diagnosis not present

## 2023-09-25 DIAGNOSIS — Z79899 Other long term (current) drug therapy: Secondary | ICD-10-CM | POA: Diagnosis not present

## 2023-09-25 DIAGNOSIS — F320 Major depressive disorder, single episode, mild: Secondary | ICD-10-CM | POA: Diagnosis not present

## 2023-10-16 DIAGNOSIS — H353221 Exudative age-related macular degeneration, left eye, with active choroidal neovascularization: Secondary | ICD-10-CM | POA: Diagnosis not present

## 2023-11-16 DIAGNOSIS — H35373 Puckering of macula, bilateral: Secondary | ICD-10-CM | POA: Diagnosis not present

## 2023-11-16 DIAGNOSIS — H353221 Exudative age-related macular degeneration, left eye, with active choroidal neovascularization: Secondary | ICD-10-CM | POA: Diagnosis not present

## 2023-11-16 DIAGNOSIS — H353111 Nonexudative age-related macular degeneration, right eye, early dry stage: Secondary | ICD-10-CM | POA: Diagnosis not present

## 2023-11-16 DIAGNOSIS — H43393 Other vitreous opacities, bilateral: Secondary | ICD-10-CM | POA: Diagnosis not present

## 2023-11-16 DIAGNOSIS — H43813 Vitreous degeneration, bilateral: Secondary | ICD-10-CM | POA: Diagnosis not present

## 2023-12-06 ENCOUNTER — Ambulatory Visit: Payer: PPO | Admitting: Dermatology

## 2023-12-10 ENCOUNTER — Ambulatory Visit: Payer: PPO | Admitting: Dermatology

## 2023-12-10 ENCOUNTER — Encounter: Payer: Self-pay | Admitting: Dermatology

## 2023-12-10 DIAGNOSIS — D0439 Carcinoma in situ of skin of other parts of face: Secondary | ICD-10-CM | POA: Diagnosis not present

## 2023-12-10 DIAGNOSIS — D1801 Hemangioma of skin and subcutaneous tissue: Secondary | ICD-10-CM

## 2023-12-10 DIAGNOSIS — D229 Melanocytic nevi, unspecified: Secondary | ICD-10-CM

## 2023-12-10 DIAGNOSIS — C4491 Basal cell carcinoma of skin, unspecified: Secondary | ICD-10-CM

## 2023-12-10 DIAGNOSIS — L578 Other skin changes due to chronic exposure to nonionizing radiation: Secondary | ICD-10-CM

## 2023-12-10 DIAGNOSIS — Z85828 Personal history of other malignant neoplasm of skin: Secondary | ICD-10-CM | POA: Diagnosis not present

## 2023-12-10 DIAGNOSIS — D492 Neoplasm of unspecified behavior of bone, soft tissue, and skin: Secondary | ICD-10-CM

## 2023-12-10 DIAGNOSIS — D099 Carcinoma in situ, unspecified: Secondary | ICD-10-CM

## 2023-12-10 DIAGNOSIS — L570 Actinic keratosis: Secondary | ICD-10-CM

## 2023-12-10 DIAGNOSIS — Z86018 Personal history of other benign neoplasm: Secondary | ICD-10-CM | POA: Diagnosis not present

## 2023-12-10 DIAGNOSIS — Z1283 Encounter for screening for malignant neoplasm of skin: Secondary | ICD-10-CM | POA: Diagnosis not present

## 2023-12-10 DIAGNOSIS — C4441 Basal cell carcinoma of skin of scalp and neck: Secondary | ICD-10-CM

## 2023-12-10 DIAGNOSIS — L814 Other melanin hyperpigmentation: Secondary | ICD-10-CM

## 2023-12-10 DIAGNOSIS — E785 Hyperlipidemia, unspecified: Secondary | ICD-10-CM | POA: Insufficient documentation

## 2023-12-10 DIAGNOSIS — F32A Depression, unspecified: Secondary | ICD-10-CM | POA: Insufficient documentation

## 2023-12-10 DIAGNOSIS — L821 Other seborrheic keratosis: Secondary | ICD-10-CM | POA: Diagnosis not present

## 2023-12-10 DIAGNOSIS — W908XXA Exposure to other nonionizing radiation, initial encounter: Secondary | ICD-10-CM | POA: Diagnosis not present

## 2023-12-10 DIAGNOSIS — M1990 Unspecified osteoarthritis, unspecified site: Secondary | ICD-10-CM | POA: Insufficient documentation

## 2023-12-10 DIAGNOSIS — Z872 Personal history of diseases of the skin and subcutaneous tissue: Secondary | ICD-10-CM | POA: Diagnosis not present

## 2023-12-10 DIAGNOSIS — C4492 Squamous cell carcinoma of skin, unspecified: Secondary | ICD-10-CM

## 2023-12-10 DIAGNOSIS — C44311 Basal cell carcinoma of skin of nose: Secondary | ICD-10-CM

## 2023-12-10 HISTORY — DX: Basal cell carcinoma of skin, unspecified: C44.91

## 2023-12-10 HISTORY — DX: Carcinoma in situ, unspecified: D09.9

## 2023-12-10 HISTORY — DX: Personal history of diseases of the skin and subcutaneous tissue: Z87.2

## 2023-12-10 HISTORY — DX: Squamous cell carcinoma of skin, unspecified: C44.92

## 2023-12-10 NOTE — Progress Notes (Signed)
Follow-Up Visit   Subjective  Dwayne Banks is a 83 y.o. male who presents for the following: Skin Cancer Screening and Upper Body Skin Exam. Hx of multiple BCCs. HxSCC, Hx AKs, Hx of dysplastic nevi.  Areas of concern on left medial calf, left sideburn area.   The patient presents for Upper Body Skin Exam (UBSE) for skin cancer screening and mole check. The patient has spots, moles and lesions to be evaluated, some may be new or changing and the patient may have concern these could be cancer.    The following portions of the chart were reviewed this encounter and updated as appropriate: medications, allergies, medical history  Review of Systems:  No other skin or systemic complaints except as noted in HPI or Assessment and Plan.  Objective  Well appearing patient in no apparent distress; mood and affect are within normal limits.  All skin waist up examined, left lower leg examined. Relevant physical exam findings are noted in the Assessment and Plan.  A. Right paranasal Pink papule with central ulcer 5 mm  B. Right Nasal Sidewall Superior Two pink papules with central ulcers 4 mm C. Right Nasal Sidewall Inferior Skin coloured papule with telangiectasia and pigment globules 7 mm D. Left infraorbital Cheek Pink scaly plaque 13 mm  E. Left Preauricular Area Pink keratotic papule 6 mm  F. Left Lateral Neck Inferior to Ear Pink ulcerated papule with telangiectasias 6 mm  G. Right Lateral Neck Pink scar-like macule 5 mm   Assessment & Plan   HISTORY OF BASAL CELL CARCINOMA OF THE SKIN. Multiple sites.  -biopsied higher risk skin cancers on face and neck today. Additional likely skin cancers: BCC on central upper back adjacent to scar concerning for recurrence after EDC. New BCC mid right paraspinal back. Possible BCC left ant shoulder. L cheek next to scar AK  Diffuse actinic damage and actinic keratoses on face trunk upper extremities - address at future  visit  HISTORY OF SQUAMOUS CELL CARCINOMA OF THE SKIN. Right mid lateral nose/medial cheek. 09/19/2017. - No evidence of recurrence today - No lymphadenopathy - Recommend regular full body skin exams - Recommend daily broad spectrum sunscreen SPF 30+ to sun-exposed areas, reapply every 2 hours as needed.  - Call if any new or changing lesions are noted between office visits   NEOPLASM OF SKIN (7) A. Right paranasal Skin / nail biopsy Type of biopsy: tangential   Informed consent: discussed and consent obtained   Timeout: patient name, date of birth, surgical site, and procedure verified   Procedure prep:  Patient was prepped and draped in usual sterile fashion Prep type:  Isopropyl alcohol Anesthesia: the lesion was anesthetized in a standard fashion   Anesthetic:  1% lidocaine w/ epinephrine 1-100,000 buffered w/ 8.4% NaHCO3 Instrument used: DermaBlade   Hemostasis achieved with: pressure and aluminum chloride   Outcome: patient tolerated procedure well   Post-procedure details: sterile dressing applied and wound care instructions given   Dressing type: bandage and petrolatum   Specimen 6 - Surgical pathology Differential Diagnosis: BCC vs SCC  Check Margins: No B. Right Nasal Sidewall Superior Skin / nail biopsy Type of biopsy: tangential   Informed consent: discussed and consent obtained   Timeout: patient name, date of birth, surgical site, and procedure verified   Procedure prep:  Patient was prepped and draped in usual sterile fashion Prep type:  Isopropyl alcohol Anesthesia: the lesion was anesthetized in a standard fashion   Anesthetic:  1% lidocaine  w/ epinephrine 1-100,000 buffered w/ 8.4% NaHCO3 Instrument used: DermaBlade   Hemostasis achieved with: pressure and aluminum chloride   Outcome: patient tolerated procedure well   Post-procedure details: sterile dressing applied and wound care instructions given   Dressing type: bandage and petrolatum   Specimen 7 -  Surgical pathology Differential Diagnosis: BCC vs SCC  Check Margins: No C. Right Nasal Sidewall Inferior Skin / nail biopsy Type of biopsy: tangential   Informed consent: discussed and consent obtained   Timeout: patient name, date of birth, surgical site, and procedure verified   Procedure prep:  Patient was prepped and draped in usual sterile fashion Prep type:  Isopropyl alcohol Anesthesia: the lesion was anesthetized in a standard fashion   Anesthetic:  1% lidocaine w/ epinephrine 1-100,000 buffered w/ 8.4% NaHCO3 Instrument used: DermaBlade   Hemostasis achieved with: pressure and aluminum chloride   Outcome: patient tolerated procedure well   Post-procedure details: sterile dressing applied and wound care instructions given   Dressing type: bandage and petrolatum   Specimen 5 - Surgical pathology Differential Diagnosis: BCC  Check Margins: No D. Left infraorbital Cheek Skin / nail biopsy Type of biopsy: tangential   Informed consent: discussed and consent obtained   Timeout: patient name, date of birth, surgical site, and procedure verified   Procedure prep:  Patient was prepped and draped in usual sterile fashion Prep type:  Isopropyl alcohol Anesthesia: the lesion was anesthetized in a standard fashion   Anesthetic:  1% lidocaine w/ epinephrine 1-100,000 buffered w/ 8.4% NaHCO3 Instrument used: DermaBlade   Hemostasis achieved with: pressure and aluminum chloride   Outcome: patient tolerated procedure well   Post-procedure details: sterile dressing applied and wound care instructions given   Dressing type: bandage and petrolatum   Specimen 4 - Surgical pathology Differential Diagnosis: SCC vs BCC  Check Margins: No E. Left Preauricular Area Skin / nail biopsy Type of biopsy: tangential   Informed consent: discussed and consent obtained   Timeout: patient name, date of birth, surgical site, and procedure verified   Procedure prep:  Patient was prepped and draped  in usual sterile fashion Prep type:  Isopropyl alcohol Anesthesia: the lesion was anesthetized in a standard fashion   Anesthetic:  1% lidocaine w/ epinephrine 1-100,000 buffered w/ 8.4% NaHCO3 Instrument used: DermaBlade   Hemostasis achieved with: pressure and aluminum chloride   Outcome: patient tolerated procedure well   Post-procedure details: sterile dressing applied and wound care instructions given   Dressing type: bandage and petrolatum   Specimen 3 - Surgical pathology Differential Diagnosis: SCC  Check Margins: No F. Left Lateral Neck Inferior to Ear Skin / nail biopsy Type of biopsy: tangential   Informed consent: discussed and consent obtained   Timeout: patient name, date of birth, surgical site, and procedure verified   Procedure prep:  Patient was prepped and draped in usual sterile fashion Prep type:  Isopropyl alcohol Anesthesia: the lesion was anesthetized in a standard fashion   Anesthetic:  1% lidocaine w/ epinephrine 1-100,000 buffered w/ 8.4% NaHCO3 Instrument used: DermaBlade   Hemostasis achieved with: pressure and aluminum chloride   Outcome: patient tolerated procedure well   Post-procedure details: sterile dressing applied and wound care instructions given   Dressing type: bandage and petrolatum   Specimen 2 - Surgical pathology Differential Diagnosis: BCC  Check Margins: No G. Right Lateral Neck Skin / nail biopsy Type of biopsy: tangential   Informed consent: discussed and consent obtained   Timeout: patient name, date of  birth, surgical site, and procedure verified   Procedure prep:  Patient was prepped and draped in usual sterile fashion Prep type:  Isopropyl alcohol Anesthesia: the lesion was anesthetized in a standard fashion   Anesthetic:  1% lidocaine w/ epinephrine 1-100,000 buffered w/ 8.4% NaHCO3 Instrument used: DermaBlade   Hemostasis achieved with: pressure and aluminum chloride   Outcome: patient tolerated procedure well    Post-procedure details: sterile dressing applied and wound care instructions given   Dressing type: bandage and petrolatum   Specimen 1 - Surgical pathology Differential Diagnosis: BCC  Check Margins: No LENTIGINES   MULTIPLE BENIGN NEVI   SEBORRHEIC KERATOSES   ACTINIC ELASTOSIS   CHERRY ANGIOMA   ACTINIC KERATOSES   Skin cancer screening performed today.  Actinic Damage - Chronic condition, secondary to cumulative UV/sun exposure - diffuse scaly erythematous macules with underlying dyspigmentation - Recommend daily broad spectrum sunscreen SPF 30+ to sun-exposed areas, reapply every 2 hours as needed.  - Staying in the shade or wearing long sleeves, sun glasses (UVA+UVB protection) and wide brim hats (4-inch brim around the entire circumference of the hat) are also recommended for sun protection.  - Call for new or changing lesions.  Lentigines, Seborrheic Keratoses, Hemangiomas - Benign normal skin lesions - Benign-appearing - Call for any changes  Melanocytic Nevi - Tan-brown and/or pink-flesh-colored symmetric macules and papules - Benign appearing on exam today - Observation - Call clinic for new or changing moles - Recommend daily use of broad spectrum spf 30+ sunscreen to sun-exposed areas.     Return in about 3 months (around 03/09/2024) for TBSE, HxBCCs, HxSCC, HxDN, HxAKs.  I, Lawson Radar, CMA, am acting as scribe for Elie Goody, MD.   Documentation: I have reviewed the above documentation for accuracy and completeness, and I agree with the above.  Elie Goody, MD

## 2023-12-10 NOTE — Patient Instructions (Addendum)
Wound Care Instructions  Cleanse wound gently with soap and water once a day then pat dry with clean gauze. Apply a thin coat of Petrolatum (petroleum jelly, "Vaseline") over the wound (unless you have an allergy to this). We recommend that you use a new, sterile tube of Vaseline. Do not pick or remove scabs. Do not remove the yellow or white "healing tissue" from the base of the wound.  Cover the wound with fresh, clean, nonstick gauze and secure with paper tape. You may use Band-Aids in place of gauze and tape if the wound is small enough, but would recommend trimming much of the tape off as there is often too much. Sometimes Band-Aids can irritate the skin.  You should call the office for your biopsy report after 1 week if you have not already been contacted.  If you experience any problems, such as abnormal amounts of bleeding, swelling, significant bruising, significant pain, or evidence of infection, please call the office immediately.  FOR ADULT SURGERY PATIENTS: If you need something for pain relief you may take 1 extra strength Tylenol (acetaminophen) AND 2 Ibuprofen (200mg  each) together every 4 hours as needed for pain. (do not take these if you are allergic to them or if you have a reason you should not take them.) Typically, you may only need pain medication for 1 to 3 days.       Recommend daily broad spectrum sunscreen SPF 30+ to sun-exposed areas, reapply every 2 hours as needed. Call for new or changing lesions.  Staying in the shade or wearing long sleeves, sun glasses (UVA+UVB protection) and wide brim hats (4-inch brim around the entire circumference of the hat) are also recommended for sun protection.    Melanoma ABCDEs  Melanoma is the most dangerous type of skin cancer, and is the leading cause of death from skin disease.  You are more likely to develop melanoma if you: Have light-colored skin, light-colored eyes, or red or blond hair Spend a lot of time in the sun Tan  regularly, either outdoors or in a tanning bed Have had blistering sunburns, especially during childhood Have a close family member who has had a melanoma Have atypical moles or large birthmarks  Early detection of melanoma is key since treatment is typically straightforward and cure rates are extremely high if we catch it early.   The first sign of melanoma is often a change in a mole or a new dark spot.  The ABCDE system is a way of remembering the signs of melanoma.  A for asymmetry:  The two halves do not match. B for border:  The edges of the growth are irregular. C for color:  A mixture of colors are present instead of an even brown color. D for diameter:  Melanomas are usually (but not always) greater than 6mm - the size of a pencil eraser. E for evolution:  The spot keeps changing in size, shape, and color.  Please check your skin once per month between visits. You can use a small mirror in front and a large mirror behind you to keep an eye on the back side or your body.   If you see any new or changing lesions before your next follow-up, please call to schedule a visit.  Please continue daily skin protection including broad spectrum sunscreen SPF 30+ to sun-exposed areas, reapplying every 2 hours as needed when you're outdoors.   Staying in the shade or wearing long sleeves, sun glasses (UVA+UVB protection) and  wide brim hats (4-inch brim around the entire circumference of the hat) are also recommended for sun protection.     Due to recent changes in healthcare laws, you may see results of your pathology and/or laboratory studies on MyChart before the doctors have had a chance to review them. We understand that in some cases there may be results that are confusing or concerning to you. Please understand that not all results are received at the same time and often the doctors may need to interpret multiple results in order to provide you with the best plan of care or course of  treatment. Therefore, we ask that you please give [place] 2 business days to thoroughly review all your results before contacting the office for clarification. Should we see a critical lab result, you will be contacted sooner.   If You Need Anything After Your Visit  If you have any questions or concerns for your doctor, please call our main line at [phone] and press option 4 to reach your doctor's medical assistant. If no one answers, please leave a voicemail as directed and we will return your call as soon as possible. Messages left after 4 pm will be answered the following business day.   You may also send [place] a message via MyChart. We typically respond to MyChart messages within 1-2 business days.  For prescription refills, please ask your pharmacy to contact our office. Our fax number is [phone].  If you have an urgent issue when the clinic is closed that cannot wait until the next business day, you can page your doctor at the number below.    Please note that while we do our best to be available for urgent issues outside of office hours, we are not available 24/7.   If you have an urgent issue and are unable to reach [place], you may choose to seek medical care at your doctor's office, retail clinic, urgent care center, or emergency room.  If you have a medical emergency, please immediately call 911 or go to the emergency department.  Pager Numbers  - Dr. Gwen Pounds: [phone]  - Dr. Roseanne Reno: [phone]  - Dr. Katrinka Blazing: [phone]   In the event of inclement weather, please call our main line at [phone] for an update on the status of any delays or closures.  Dermatology Medication Tips: Please keep the boxes that topical medications come in in order to help keep track of the instructions about where and how to use these. Pharmacies typically print the medication instructions only on the boxes and not directly on the medication tubes.   If your medication is too expensive,  please contact our office at [phone] option 4 or send [place] a message through MyChart.   We are unable to tell what your co-pay for medications will be in advance as this is different depending on your insurance coverage. However, we may be able to find a substitute medication at lower cost or fill out paperwork to get insurance to cover a needed medication.   If a prior authorization is required to get your medication covered by your insurance company, please allow [place] 1-2 business days to complete this process.  Drug prices often vary depending on where the prescription is filled and some pharmacies may offer cheaper prices.  The website www.goodrx.com contains coupons for medications through different pharmacies. The prices here do not account for what the cost may be with help from insurance (it may be cheaper with your insurance), but the website  can give you the price if you did not use any insurance.  - You can print the associated coupon and take it with your prescription to the pharmacy.  - You may also stop by our office during regular business hours and pick up a GoodRx coupon card.  - If you need your prescription sent electronically to a different pharmacy, notify our office through [facility] or by phone at [phone] option 4.     Si Usted Necesita Algo Despus de Su Visita  Tambin puede enviarnos un mensaje a travs de Clinical cytogeneticist. Por lo general respondemos a los mensajes de MyChart en el transcurso de 1 a 2 das hbiles.  Para renovar recetas, por favor pida a su farmacia que se ponga en contacto con nuestra oficina. Annie Sable de fax es [place] [phone].  Si tiene un asunto urgente cuando la clnica est cerrada y que no puede esperar hasta el siguiente da hbil, puede llamar/localizar a su doctor(a) al nmero que aparece a continuacin.   Por favor, tenga en cuenta que aunque hacemos todo lo posible para estar disponibles para asuntos urgentes fuera del  horario de [employer], no estamos disponibles las 24 horas del da, los 7 [address]  [address] de la [place].   Si tiene un problema urgente y no puede comunicarse con nosotros, puede optar por buscar atencin mdica  en el consultorio de su doctor(a), en una clnica privada, en un centro de atencin urgente o en una sala de emergencias.  Si tiene Engineer, drilling, por favor llame inmediatamente al 911 o vaya a la sala de emergencias.  Nmeros de bper  - Dr. Gwen Pounds: [phone]  - Dra. Roseanne Reno: [phone]  - Dr. Katrinka Blazing: [phone]   En caso de inclemencias del tiempo, por favor llame a Lacy Duverney principal al [phone] para una actualizacin sobre el [place] de cualquier retraso o cierre.  Consejos para la medicacin en dermatologa: Por favor, guarde las cajas en las que vienen los medicamentos de uso tpico para ayudarle a seguir las instrucciones sobre dnde y cmo usarlos. Las farmacias generalmente imprimen las instrucciones del medicamento slo en las cajas y no directamente en los tubos del [place].   Si su medicamento es muy caro, por favor, pngase en contacto con Rolm Gala llamando al [phone] y presione la opcin 4 o envenos un mensaje a travs de Clinical cytogeneticist.   No podemos decirle cul ser su copago por los medicamentos por adelantado ya que esto es diferente dependiendo de la cobertura de su seguro. Sin embargo, es posible que podamos encontrar un medicamento sustituto a Audiological scientist un formulario para que el seguro cubra el medicamento que se considera necesario.   Si se requiere una autorizacin previa para que su compaa de seguros [place] su medicamento, por favor permtanos de 1 a 2 das hbiles para completar [address]  Los precios de los medicamentos varan con frecuencia dependiendo del Environmental consultant de dnde se surte la receta y alguna farmacias pueden ofrecer precios ms baratos.  El sitio web www.goodrx.com tiene cupones para medicamentos de Engineer, civil (consulting). Los precios aqu no tienen en cuenta lo que podra costar con la ayuda del seguro (puede ser ms barato con su seguro), pero el sitio web puede darle el precio si no utiliz Tourist information centre manager.  - Puede imprimir el cupn correspondiente y llevarlo con su receta a la farmacia.  - Tambin puede pasar por nuestra oficina durante el horario de atencin regular y Education officer, museum una tarjeta de cupones de GoodRx.  -  Si necesita que su receta se enve electrnicamente a Psychiatrist, informe a nuestra oficina a travs de MyChart de [place] o por telfono llamando al [phone] y presione la opcin 4.

## 2023-12-12 ENCOUNTER — Telehealth: Payer: Self-pay

## 2023-12-12 LAB — SURGICAL PATHOLOGY

## 2023-12-12 NOTE — Telephone Encounter (Signed)
Patient's wife called today regarding husband's last visit with Dr. Katrinka Blazing. States she has been receiving phone calls and was not sure if it was about his recent pathology from 12/10/2023. She states it was from a skin plastic surgery center.   Advised patient's wife there was no phone encounter notes documented in chart showing we sent a referral or made an appointment for him. Results take usually about a week and we would call them first before scheduling further treatments.   Routing to provider to inform.

## 2023-12-13 ENCOUNTER — Telehealth: Payer: Self-pay

## 2023-12-13 DIAGNOSIS — C44311 Basal cell carcinoma of skin of nose: Secondary | ICD-10-CM

## 2023-12-13 NOTE — Telephone Encounter (Addendum)
Called and discussed bx results with patient and his wife. They prefer to have Mohs surgery with Dr. Jeannine Boga in [place]. Patient also would like to plan for [facility] procedure for left preauricular area at next follow up appointment in March 03 2024.   Amb referral for Mohs with Dr. Jeannine Boga placed.     ----- Message from Elie Goody sent at 12/13/2023  9:51 AM EST ----- Diagnosis:   1. Skin, (A) right paranasal, shave biopsy :      BASAL CELL CARCINOMA, NODULAR PATTERN, ULCERATED       2. Skin, (B) right nasal sidewall superior, shave biopsy :      SQUAMOUS CELL CARCINOMA IN SITU, BASE INVOLVED, ULCERATED       3. Skin, (C) right nasal sidewall inferior, shave biopsy :      BASAL CELL CARCINOMA, NODULAR PATTERN       4. Skin, (D) left infraorbital cheek, shave biopsy :      SQUAMOUS CELL CARCINOMA IN SITU, HYPERTROPHIC, BASE INVOLVED       5. Skin, (E) left preauricular area, shave biopsy :      SQUAMOUS CELL CARCINOMA IN SITU, HYPERTROPHIC, BASE INVOLVED       6. Skin, (F) left lateral neck inferior to ear, shave biopsy :      BASAL CELL CARCINOMA, NODULAR PATTERN       7. Skin, (G) right lateral neck, shave biopsy :      HYPERTROPHIC ACTINIC KERATOSIS    Please call with diagnosis and determine where the patient would like to have Mohs surgery.  ----------------------------------------- SITES 1, 3, 6 Explanation: your biopsies show a basal cell skin cancers in the second layer of the skin. This is the most common kind of skin cancer and is caused by damage from sun exposure. Basal cell skin cancers almost never spread beyond the skin, so they are not dangerous to your overall health. However, they will continue to grow, can bleed, cause nonhealing wounds, and disrupt nearby structures unless fully treated.  SITES 2, 4 Your biopsies show a squamous cell skin cancers in the top layer of skin. This means it is an early cancer and has not spread. However, it has the potential to  spread beyond the skin and threaten your health, so we recommend treating it.  Treatment: Given the locations and types of skin cancer, I recommend Mohs surgery. Mohs surgery involves cutting out the skin cancer and then checking under the microscope to ensure the whole skin cancer was removed. If any skin cancer remains, the surgeon will cut out more until it is fully removed. The cure rate is about 98-99%. Once the Mohs surgeon confirms the skin cancer is out, they will discuss the options to repair or heal the area. You must take it easy for about two weeks after surgery (no lifting over 10-15 lbs, avoid activity to get your heart rate and blood pressure up). It is done at another office outside of [place] ([place], [place], or [place]). PLEASE NOTE that the Mohs surgery on the nose and next to the nose may be extensive due to the number and proximity of skin cancers. -----------------------------------------  SITE 5 Biopsy shows a squamous cell skin cancer in the top layer of skin  Treatment: ED&C vs Mohs  -------------------------------------------- SITE 7 Biopsy shows a thickened precancer. It will likely resolve after the biopsy. If it regrows, contact us for treatment

## 2023-12-17 DIAGNOSIS — H353221 Exudative age-related macular degeneration, left eye, with active choroidal neovascularization: Secondary | ICD-10-CM | POA: Diagnosis not present

## 2023-12-18 ENCOUNTER — Telehealth: Payer: Self-pay

## 2023-12-18 NOTE — Telephone Encounter (Signed)
 Patient wife called she does not want to treat all 6 biopsy proven skin cancers with Mohs surgery, she would like to have Dr MARLA treat some of the skin cancers with [facility] like he has done in the past, pt is scheduled with Dr MARLA in Sept and she will address this with him at that visit, pt would like to be placed on the cancellation list if Dr MARLA get any sooner appts,  Pt will keep his first Mohs surgery appt with Dr Lloyd to discuss treatment options as well, I discussed with pt wife there is no guarantee Dr MARLA will be able to treat these skin cancers with [facility], she would still like to keep her appt with Dr MARLA in Sept and with Dr Claudene in April.

## 2024-01-11 DIAGNOSIS — H353111 Nonexudative age-related macular degeneration, right eye, early dry stage: Secondary | ICD-10-CM | POA: Diagnosis not present

## 2024-01-11 DIAGNOSIS — H43813 Vitreous degeneration, bilateral: Secondary | ICD-10-CM | POA: Diagnosis not present

## 2024-01-11 DIAGNOSIS — H43393 Other vitreous opacities, bilateral: Secondary | ICD-10-CM | POA: Diagnosis not present

## 2024-01-11 DIAGNOSIS — H35373 Puckering of macula, bilateral: Secondary | ICD-10-CM | POA: Diagnosis not present

## 2024-01-11 DIAGNOSIS — H353221 Exudative age-related macular degeneration, left eye, with active choroidal neovascularization: Secondary | ICD-10-CM | POA: Diagnosis not present

## 2024-01-15 DIAGNOSIS — C44311 Basal cell carcinoma of skin of nose: Secondary | ICD-10-CM | POA: Diagnosis not present

## 2024-01-15 DIAGNOSIS — C44321 Squamous cell carcinoma of skin of nose: Secondary | ICD-10-CM | POA: Diagnosis not present

## 2024-01-17 DIAGNOSIS — E538 Deficiency of other specified B group vitamins: Secondary | ICD-10-CM | POA: Diagnosis not present

## 2024-01-17 DIAGNOSIS — F320 Major depressive disorder, single episode, mild: Secondary | ICD-10-CM | POA: Diagnosis not present

## 2024-01-17 DIAGNOSIS — Z79899 Other long term (current) drug therapy: Secondary | ICD-10-CM | POA: Diagnosis not present

## 2024-01-17 DIAGNOSIS — E7800 Pure hypercholesterolemia, unspecified: Secondary | ICD-10-CM | POA: Diagnosis not present

## 2024-01-17 DIAGNOSIS — Z0000 Encounter for general adult medical examination without abnormal findings: Secondary | ICD-10-CM | POA: Diagnosis not present

## 2024-01-28 ENCOUNTER — Telehealth: Payer: Self-pay

## 2024-01-28 NOTE — Telephone Encounter (Signed)
 Specimen tracking and history updated from Mohs Dr. Jeannine Boga 01/15/24 right nasal sidewall inferior progress notes. aw

## 2024-01-29 DIAGNOSIS — H353221 Exudative age-related macular degeneration, left eye, with active choroidal neovascularization: Secondary | ICD-10-CM | POA: Diagnosis not present

## 2024-02-18 DIAGNOSIS — D0439 Carcinoma in situ of skin of other parts of face: Secondary | ICD-10-CM | POA: Diagnosis not present

## 2024-02-28 DIAGNOSIS — D0439 Carcinoma in situ of skin of other parts of face: Secondary | ICD-10-CM | POA: Diagnosis not present

## 2024-03-03 ENCOUNTER — Ambulatory Visit: Payer: PPO | Admitting: Dermatology

## 2024-03-03 ENCOUNTER — Encounter: Payer: Self-pay | Admitting: Dermatology

## 2024-03-03 DIAGNOSIS — L821 Other seborrheic keratosis: Secondary | ICD-10-CM

## 2024-03-03 DIAGNOSIS — D1801 Hemangioma of skin and subcutaneous tissue: Secondary | ICD-10-CM | POA: Diagnosis not present

## 2024-03-03 DIAGNOSIS — C44519 Basal cell carcinoma of skin of other part of trunk: Secondary | ICD-10-CM

## 2024-03-03 DIAGNOSIS — W098XXA Fall on or from other playground equipment, initial encounter: Secondary | ICD-10-CM

## 2024-03-03 DIAGNOSIS — L814 Other melanin hyperpigmentation: Secondary | ICD-10-CM | POA: Diagnosis not present

## 2024-03-03 DIAGNOSIS — D492 Neoplasm of unspecified behavior of bone, soft tissue, and skin: Secondary | ICD-10-CM | POA: Diagnosis not present

## 2024-03-03 DIAGNOSIS — C4491 Basal cell carcinoma of skin, unspecified: Secondary | ICD-10-CM

## 2024-03-03 DIAGNOSIS — L570 Actinic keratosis: Secondary | ICD-10-CM | POA: Diagnosis not present

## 2024-03-03 DIAGNOSIS — Z1283 Encounter for screening for malignant neoplasm of skin: Secondary | ICD-10-CM

## 2024-03-03 DIAGNOSIS — Z86018 Personal history of other benign neoplasm: Secondary | ICD-10-CM

## 2024-03-03 DIAGNOSIS — Z85828 Personal history of other malignant neoplasm of skin: Secondary | ICD-10-CM

## 2024-03-03 DIAGNOSIS — D489 Neoplasm of uncertain behavior, unspecified: Secondary | ICD-10-CM

## 2024-03-03 DIAGNOSIS — D229 Melanocytic nevi, unspecified: Secondary | ICD-10-CM

## 2024-03-03 DIAGNOSIS — L578 Other skin changes due to chronic exposure to nonionizing radiation: Secondary | ICD-10-CM

## 2024-03-03 NOTE — Addendum Note (Signed)
 Addended by: Harris Liming on: 03/03/2024 01:39 PM   Modules accepted: Level of Service

## 2024-03-03 NOTE — Progress Notes (Deleted)
   Follow-Up Visit   Subjective  Dwayne Banks is a 83 y.o. male who presents for the following: Skin Cancer Screening and Full Body Skin Exam  The patient presents for Total-Body Skin Exam (TBSE) for skin cancer screening and mole check. The patient has spots, moles and lesions to be evaluated, some may be new or changing and the patient may have concern these could be cancer.   Wife is with patient and contributes to history.   The following portions of the chart were reviewed this encounter and updated as appropriate: medications, allergies, medical history  Review of Systems:  No other skin or systemic complaints except as noted in HPI or Assessment and Plan.  Objective  Well appearing patient in no apparent distress; mood and affect are within normal limits.  A full examination was performed including scalp, head, eyes, ears, nose, lips, neck, chest, axillae, abdomen, back, buttocks, bilateral upper extremities, bilateral lower extremities, hands, feet, fingers, toes, fingernails, and toenails. All findings within normal limits unless otherwise noted below.   Relevant physical exam findings are noted in the Assessment and Plan.    Assessment & Plan   SKIN CANCER SCREENING PERFORMED TODAY.  ACTINIC DAMAGE - Chronic condition, secondary to cumulative UV/sun exposure - diffuse scaly erythematous macules with underlying dyspigmentation - Recommend daily broad spectrum sunscreen SPF 30+ to sun-exposed areas, reapply every 2 hours as needed.  - Staying in the shade or wearing long sleeves, sun glasses (UVA+UVB protection) and wide brim hats (4-inch brim around the entire circumference of the hat) are also recommended for sun protection.  - Call for new or changing lesions.  LENTIGINES, SEBORRHEIC KERATOSES, HEMANGIOMAS - Benign normal skin lesions - Benign-appearing - Call for any changes  MELANOCYTIC NEVI - Tan-brown and/or pink-flesh-colored symmetric macules and papules -  Benign appearing on exam today - Observation - Call clinic for new or changing moles - Recommend daily use of broad spectrum spf 30+ sunscreen to sun-exposed areas.        No follow-ups on file.  I, Jaylah Goodlow, CMA, am acting as scribe for Harris Liming, MD.   Documentation: I have reviewed the above documentation for accuracy and completeness, and I agree with the above.  Harris Liming, MD

## 2024-03-03 NOTE — Progress Notes (Addendum)
 Follow-Up Visit   Subjective  Dwayne Banks is a 83 y.o. male who presents for the following: here for treatment of Bx proven SCCis at left preauricular. Patient's wife states Dr. Gideon Kussmaul told them it could be frozen. They prefer to not do EDC to the area.   Recheck right lateral neck inferior to ear. Bx proven hypertrophic AK. Bx: 12/10/2023.  The patient presents for Upper Body Skin Exam (UBSE) for skin cancer screening and mole check.  The patient has spots, moles and lesions to be evaluated, some may be new or changing and the patient has concerns that these could be cancer.   Wife is with patient and contributes to history. Wife did not stay for exam.     The following portions of the chart were reviewed this encounter and updated as appropriate: medications, allergies, medical history  Review of Systems:  No other skin or systemic complaints except as noted in HPI or Assessment and Plan.  Objective  Well appearing patient in no apparent distress; mood and affect are within normal limits.  A focused examination was performed of the following areas: Face, neck, ears  Relevant physical exam findings are noted in the Assessment and Plan.  R lat Neck x1(residual), R lat cheek x3, R inf helix x1, sup to R brow x1, R sup forehead x1, central mid forehead x2, R jaw line x1, L eyebrow x1, sup to L eyebrow x2, L sup forehead x1, L temple x1, L mid cheek x1, L nasal sidewall x1 (17) Residual pink scale at biopsy site Right Abdomen (side) - Upper Pink hyperemic papule with slight scale   Assessment & Plan   HISTORY OF BASAL CELL CARCINOMA OF THE SKIN. Multiple sites, see history.  - No evidence of recurrence today - Recommend regular full body skin exams - Recommend daily broad spectrum sunscreen SPF 30+ to sun-exposed areas, reapply every 2 hours as needed.  - Call if any new or changing lesions are noted between office visits BCC on central upper back adjacent to scar concerning  for recurrence after EDC. New BCC mid right paraspinal back. Possible BCC left ant shoulder and left posterior shoulder. L cheek next to scar AK. Patient prefers to continue to watch areas for now.   HISTORY OF SQUAMOUS CELL CARCINOMA OF THE SKIN - No evidence of recurrence today - No lymphadenopathy - Recommend regular full body skin exams - Recommend daily broad spectrum sunscreen SPF 30+ to sun-exposed areas, reapply every 2 hours as needed.  - Call if any new or changing lesions are noted between office visits   HISTORY OF DYSPLASTIC NEVI No evidence of recurrence today Recommend regular full body skin exams Recommend daily broad spectrum sunscreen SPF 30+ to sun-exposed areas, reapply every 2 hours as needed.  Call if any new or changing lesions are noted between office visits   LENTIGINES, SEBORRHEIC KERATOSES, HEMANGIOMAS - Benign normal skin lesions - Benign-appearing - Call for any changes  MELANOCYTIC NEVI - Tan-brown and/or pink-flesh-colored symmetric macules and papules - Benign appearing on exam today - Observation - Call clinic for new or changing moles - Recommend daily use of broad spectrum spf 30+ sunscreen to sun-exposed areas.   ACTINIC DAMAGE - Chronic condition, secondary to cumulative UV/sun exposure - diffuse scaly erythematous macules with underlying dyspigmentation - Recommend daily broad spectrum sunscreen SPF 30+ to sun-exposed areas, reapply every 2 hours as needed.  - Staying in the shade or wearing long sleeves, sun glasses (UVA+UVB protection)  and wide brim hats (4-inch brim around the entire circumference of the hat) are also recommended for sun protection.  - Call for new or changing lesions.  SKIN CANCER SCREENING PERFORMED TODAY  AK (ACTINIC KERATOSIS) (17) R lat Neck x1(residual), R lat cheek x3, R inf helix x1, sup to R brow x1, R sup forehead x1, central mid forehead x2, R jaw line x1, L eyebrow x1, sup to L eyebrow x2, L sup forehead x1, L  temple x1, L mid cheek x1, L nasal sidewall x1 (17) Actinic keratoses are precancerous spots that appear secondary to cumulative UV radiation exposure/sun exposure over time. They are chronic with expected duration over 1 year. A portion of actinic keratoses will progress to squamous cell carcinoma of the skin. It is not possible to reliably predict which spots will progress to skin cancer and so treatment is recommended to prevent development of skin cancer.  Recommend daily broad spectrum sunscreen SPF 30+ to sun-exposed areas, reapply every 2 hours as needed.  Recommend staying in the shade or wearing long sleeves, sun glasses (UVA+UVB protection) and wide brim hats (4-inch brim around the entire circumference of the hat). Call for new or changing lesions. Destruction of lesion - R lat Neck x1(residual), R lat cheek x3, R inf helix x1, sup to R brow x1, R sup forehead x1, central mid forehead x2, R jaw line x1, L eyebrow x1, sup to L eyebrow x2, L sup forehead x1, L temple x1, L mid cheek x1, L nasal sidewall x1 (17) Complexity: simple   Destruction method: cryotherapy   Informed consent: discussed and consent obtained   Timeout:  patient name, date of birth, surgical site, and procedure verified Lesion destroyed using liquid nitrogen: Yes   Region frozen until ice ball extended beyond lesion: Yes   Cryo cycles: 1 or 2. Outcome: patient tolerated procedure well with no complications   Post-procedure details: wound care instructions given   Additional details:  Prior to procedure, discussed risks of blister formation, small wound, skin dyspigmentation, or rare scar following cryotherapy. Recommend Vaseline ointment to treated areas while healing.  BASAL CELL CARCINOMA (BCC), UNSPECIFIED SITE   SEBORRHEIC KERATOSES   MULTIPLE BENIGN NEVI   LENTIGINES   CHERRY ANGIOMA   NEOPLASM OF UNCERTAIN BEHAVIOR Right Abdomen (side) - Upper Recommended biopsy to check for SCC. Risk of spread if  untreated SCC. Patient prefers to show Dr Gideon Kussmaul and get opinion on needed treatment   Return for Follow Up As Scheduled, With Dr. Bary Likes.  I, Jill Parcell, CMA, am acting as scribe for Harris Liming, MD.   Documentation: I have reviewed the above documentation for accuracy and completeness, and I agree with the above.  Harris Liming, MD

## 2024-03-03 NOTE — Patient Instructions (Addendum)

## 2024-03-04 DIAGNOSIS — H353221 Exudative age-related macular degeneration, left eye, with active choroidal neovascularization: Secondary | ICD-10-CM | POA: Diagnosis not present

## 2024-03-04 DIAGNOSIS — H353111 Nonexudative age-related macular degeneration, right eye, early dry stage: Secondary | ICD-10-CM | POA: Diagnosis not present

## 2024-03-04 DIAGNOSIS — H35373 Puckering of macula, bilateral: Secondary | ICD-10-CM | POA: Diagnosis not present

## 2024-03-04 DIAGNOSIS — H43813 Vitreous degeneration, bilateral: Secondary | ICD-10-CM | POA: Diagnosis not present

## 2024-03-04 DIAGNOSIS — H43393 Other vitreous opacities, bilateral: Secondary | ICD-10-CM | POA: Diagnosis not present

## 2024-03-17 ENCOUNTER — Telehealth: Payer: Self-pay

## 2024-03-17 NOTE — Telephone Encounter (Signed)
 MOHs and progress notes updated from L preauricular area. aw

## 2024-04-04 DIAGNOSIS — H353221 Exudative age-related macular degeneration, left eye, with active choroidal neovascularization: Secondary | ICD-10-CM | POA: Diagnosis not present

## 2024-04-29 DIAGNOSIS — C4441 Basal cell carcinoma of skin of scalp and neck: Secondary | ICD-10-CM | POA: Diagnosis not present

## 2024-05-05 DIAGNOSIS — H43393 Other vitreous opacities, bilateral: Secondary | ICD-10-CM | POA: Diagnosis not present

## 2024-05-05 DIAGNOSIS — H353221 Exudative age-related macular degeneration, left eye, with active choroidal neovascularization: Secondary | ICD-10-CM | POA: Diagnosis not present

## 2024-05-05 DIAGNOSIS — H35373 Puckering of macula, bilateral: Secondary | ICD-10-CM | POA: Diagnosis not present

## 2024-05-05 DIAGNOSIS — H43813 Vitreous degeneration, bilateral: Secondary | ICD-10-CM | POA: Diagnosis not present

## 2024-05-05 DIAGNOSIS — H353111 Nonexudative age-related macular degeneration, right eye, early dry stage: Secondary | ICD-10-CM | POA: Diagnosis not present

## 2024-05-13 DIAGNOSIS — C44319 Basal cell carcinoma of skin of other parts of face: Secondary | ICD-10-CM | POA: Diagnosis not present

## 2024-05-21 ENCOUNTER — Telehealth: Payer: Self-pay

## 2024-05-21 NOTE — Telephone Encounter (Signed)
 Specimen tracking and history updated from [facility] progress notes of BCC - L lateral neck inferior to ear.

## 2024-05-26 ENCOUNTER — Telehealth: Payer: Self-pay

## 2024-05-26 NOTE — Telephone Encounter (Signed)
 Speicmen tracking and history updated from [facility] progress notes by Dr. Samule Lunger on 05/13/24 for [facility] on right paranasal. aw

## 2024-06-02 DIAGNOSIS — H353221 Exudative age-related macular degeneration, left eye, with active choroidal neovascularization: Secondary | ICD-10-CM | POA: Diagnosis not present

## 2024-07-01 ENCOUNTER — Ambulatory Visit: Admitting: Dermatology

## 2024-07-01 DIAGNOSIS — L820 Inflamed seborrheic keratosis: Secondary | ICD-10-CM | POA: Diagnosis not present

## 2024-07-01 DIAGNOSIS — L570 Actinic keratosis: Secondary | ICD-10-CM

## 2024-07-01 DIAGNOSIS — Z79899 Other long term (current) drug therapy: Secondary | ICD-10-CM

## 2024-07-01 DIAGNOSIS — D099 Carcinoma in situ, unspecified: Secondary | ICD-10-CM

## 2024-07-01 DIAGNOSIS — Z85828 Personal history of other malignant neoplasm of skin: Secondary | ICD-10-CM

## 2024-07-01 DIAGNOSIS — Z5111 Encounter for antineoplastic chemotherapy: Secondary | ICD-10-CM

## 2024-07-01 DIAGNOSIS — D492 Neoplasm of unspecified behavior of bone, soft tissue, and skin: Secondary | ICD-10-CM

## 2024-07-01 DIAGNOSIS — Z7189 Other specified counseling: Secondary | ICD-10-CM

## 2024-07-01 DIAGNOSIS — D485 Neoplasm of uncertain behavior of skin: Secondary | ICD-10-CM

## 2024-07-01 DIAGNOSIS — W908XXA Exposure to other nonionizing radiation, initial encounter: Secondary | ICD-10-CM | POA: Diagnosis not present

## 2024-07-01 DIAGNOSIS — L578 Other skin changes due to chronic exposure to nonionizing radiation: Secondary | ICD-10-CM | POA: Diagnosis not present

## 2024-07-01 DIAGNOSIS — D0472 Carcinoma in situ of skin of left lower limb, including hip: Secondary | ICD-10-CM | POA: Diagnosis not present

## 2024-07-01 HISTORY — DX: Carcinoma in situ, unspecified: D09.9

## 2024-07-01 NOTE — Progress Notes (Unsigned)
 Follow-Up Visit   Subjective  Dwayne Banks is a 83 y.o. male who presents for the following: The patient has a spot on his legs and face to be evaluated, some may be new or changing and the patient may have concern these could be cancer.  The patient has spots, moles and lesions to be evaluated, some may be new or changing and the patient may have concern these could be cancer.  The following portions of the chart were reviewed this encounter and updated as appropriate: medications, allergies, medical history  Review of Systems:  No other skin or systemic complaints except as noted in HPI or Assessment and Plan.  Objective  Well appearing patient in no apparent distress; mood and affect are within normal limits.  A focused examination was performed of the following areas: Left leg, right leg, face   Relevant exam findings are noted in the Assessment and Plan.  left medial calf 1.5 cm erythematous plaque  legs x 20, face x 5 (25) Erythematous thin papules/macules with gritty scale.  left temple x 1 Stuck-on, waxy, tan-brown papules and plaques -- Discussed benign etiology and prognosis.          Assessment & Plan   NEOPLASM OF SKIN left medial calf Epidermal / dermal shaving  Lesion diameter (cm):  1.5 Informed consent: discussed and consent obtained   Timeout: patient name, date of birth, surgical site, and procedure verified   Procedure prep:  Patient was prepped and draped in usual sterile fashion Prep type:  Isopropyl alcohol Anesthesia: the lesion was anesthetized in a standard fashion   Anesthetic:  1% lidocaine  w/ epinephrine 1-100,000 buffered w/ 8.4% NaHCO3 Hemostasis achieved with: pressure, aluminum chloride and electrodesiccation   Outcome: patient tolerated procedure well   Post-procedure details: sterile dressing applied and wound care instructions given   Dressing type: bandage and petrolatum    Destruction of lesion  Destruction method:  electrodesiccation and curettage   Informed consent: discussed and consent obtained   Timeout:  patient name, date of birth, surgical site, and procedure verified Anesthesia: the lesion was anesthetized in a standard fashion   Anesthetic:  1% lidocaine  w/ epinephrine 1-100,000 buffered w/ 8.4% NaHCO3 Curettage performed in three different directions: Yes   Electrodesiccation performed over the curetted area: Yes   Curettage cycles:  3 Final wound size (cm):  1.5 Hemostasis achieved with:  electrodesiccation Outcome: patient tolerated procedure well with no complications   Post-procedure details: sterile dressing applied and wound care instructions given   Dressing type: petrolatum   Additional details:  Treated with EDC  Specimen 1 - Surgical pathology Differential Diagnosis: R/O BCC  Check Margins: No AK (ACTINIC KERATOSIS) (25) legs x 20, face x 5 (25) ACTINIC DAMAGE WITH PRECANCEROUS ACTINIC KERATOSES Counseling for Topical Chemotherapy Management: Patient exhibits: - Severe, confluent actinic changes with pre-cancerous actinic keratoses that is secondary to cumulative UV radiation exposure over time - Condition that is severe; chronic, not at goal. - diffuse scaly erythematous macules and papules with underlying dyspigmentation - Discussed Prescription Field Treatment topical Chemotherapy for Severe, Chronic Confluent Actinic Changes with Pre-Cancerous Actinic Keratoses Field treatment involves treatment of an entire area of skin that has confluent Actinic Changes (Sun/ Ultraviolet light damage) and PreCancerous Actinic Keratoses by method of PhotoDynamic Therapy (PDT) and/or prescription Topical Chemotherapy agents such as 5-fluorouracil, 5-fluorouracil/calcipotriene, and/or imiquimod.  The purpose is to decrease the number of clinically evident and subclinical PreCancerous lesions to prevent progression to development of skin  cancer by chemically destroying early precancer changes  that may or may not be visible.  It has been shown to reduce the risk of developing skin cancer in the treated area. As a result of treatment, redness, scaling, crusting, and open sores may occur during treatment course. One or more than one of these methods may be used and may have to be used several times to control, suppress and eliminate the PreCancerous changes. Discussed treatment course, expected reaction, and possible side effects. - Recommend daily broad spectrum sunscreen SPF 30+ to sun-exposed areas, reapply every 2 hours as needed.  - Staying in the shade or wearing long sleeves, sun glasses (UVA+UVB protection) and wide brim hats (4-inch brim around the entire circumference of the hat) are also recommended. - Call for new or changing lesions.   We will wait until follow up appointment to decide treatment PDT /red light on the face After today's visit, he will use 5FU/Calcipotriene cream-on the face as prescribed  Destruction of lesion - legs x 20, face x 5 (25) Complexity: simple   Destruction method: cryotherapy   Informed consent: discussed and consent obtained   Timeout:  patient name, date of birth, surgical site, and procedure verified Lesion destroyed using liquid nitrogen: Yes   Region frozen until ice ball extended beyond lesion: Yes   Outcome: patient tolerated procedure well with no complications   Post-procedure details: wound care instructions given    INFLAMED SEBORRHEIC KERATOSIS left temple x 1 Symptomatic, irritating, patient would like treated.  Destruction of lesion - left temple x 1 Complexity: simple   Destruction method: cryotherapy   Informed consent: discussed and consent obtained   Timeout:  patient name, date of birth, surgical site, and procedure verified Lesion destroyed using liquid nitrogen: Yes   Region frozen until ice ball extended beyond lesion: Yes   Outcome: patient tolerated procedure well with no complications   Post-procedure details:  wound care instructions given    ACTINIC SKIN DAMAGE   COUNSELING AND COORDINATION OF CARE   MEDICATION MANAGEMENT   CHEMOTHERAPY MANAGEMENT, ENCOUNTER FOR    ACTINIC DAMAGE - chronic, secondary to cumulative UV radiation exposure/sun exposure over time - diffuse scaly erythematous macules with underlying dyspigmentation - Recommend daily broad spectrum sunscreen SPF 30+ to sun-exposed areas, reapply every 2 hours as needed.  - Recommend staying in the shade or wearing long sleeves, sun glasses (UVA+UVB protection) and wide brim hats (4-inch brim around the entire circumference of the hat). - Call for new or changing lesions.  HISTORY OF BASAL CELL CARCINOMA OF THE SKIN - multiple - No evidence of recurrence today - Recommend regular full body skin exams - Recommend daily broad spectrum sunscreen SPF 30+ to sun-exposed areas, reapply every 2 hours as needed.  - Call if any new or changing lesions are noted between office visits  Return for scheduled appointment Sept 10.  IFay Kirks, CMA, am acting as scribe for Alm Rhyme, MD .   Documentation: I have reviewed the above documentation for accuracy and completeness, and I agree with the above.  Alm Rhyme, MD

## 2024-07-01 NOTE — Patient Instructions (Addendum)

## 2024-07-02 ENCOUNTER — Ambulatory Visit: Payer: Self-pay | Admitting: Dermatology

## 2024-07-02 DIAGNOSIS — H35373 Puckering of macula, bilateral: Secondary | ICD-10-CM | POA: Diagnosis not present

## 2024-07-02 DIAGNOSIS — H43813 Vitreous degeneration, bilateral: Secondary | ICD-10-CM | POA: Diagnosis not present

## 2024-07-02 DIAGNOSIS — H43393 Other vitreous opacities, bilateral: Secondary | ICD-10-CM | POA: Diagnosis not present

## 2024-07-02 DIAGNOSIS — H353111 Nonexudative age-related macular degeneration, right eye, early dry stage: Secondary | ICD-10-CM | POA: Diagnosis not present

## 2024-07-02 DIAGNOSIS — H353221 Exudative age-related macular degeneration, left eye, with active choroidal neovascularization: Secondary | ICD-10-CM | POA: Diagnosis not present

## 2024-07-02 LAB — SURGICAL PATHOLOGY

## 2024-07-03 ENCOUNTER — Encounter: Payer: Self-pay | Admitting: Dermatology

## 2024-07-03 NOTE — Telephone Encounter (Signed)
-----   Message from Alm Rhyme sent at 07/02/2024  6:12 PM EDT ----- FINAL DIAGNOSIS        1. Skin, left medial calf :       SQUAMOUS CELL CARCINOMA IN SITU, HYPERTROPHIC, BASE INVOLVED   Cancer = SCCis Superficial Already treated Recheck next visit ----- Message ----- From: Interface, Lab In Three Zero One Sent: 07/02/2024   5:06 PM EDT To: Alm JAYSON Rhyme, MD

## 2024-07-03 NOTE — Telephone Encounter (Signed)
Advised patient's wife of bx results/sh

## 2024-07-22 DIAGNOSIS — F321 Major depressive disorder, single episode, moderate: Secondary | ICD-10-CM | POA: Diagnosis not present

## 2024-07-22 DIAGNOSIS — E782 Mixed hyperlipidemia: Secondary | ICD-10-CM | POA: Diagnosis not present

## 2024-07-22 DIAGNOSIS — E538 Deficiency of other specified B group vitamins: Secondary | ICD-10-CM | POA: Diagnosis not present

## 2024-07-22 DIAGNOSIS — Z125 Encounter for screening for malignant neoplasm of prostate: Secondary | ICD-10-CM | POA: Diagnosis not present

## 2024-07-22 DIAGNOSIS — E7800 Pure hypercholesterolemia, unspecified: Secondary | ICD-10-CM | POA: Diagnosis not present

## 2024-07-22 DIAGNOSIS — Z79899 Other long term (current) drug therapy: Secondary | ICD-10-CM | POA: Diagnosis not present

## 2024-07-23 ENCOUNTER — Ambulatory Visit: Payer: PPO | Admitting: Dermatology

## 2024-07-30 DIAGNOSIS — H353221 Exudative age-related macular degeneration, left eye, with active choroidal neovascularization: Secondary | ICD-10-CM | POA: Diagnosis not present

## 2024-08-27 DIAGNOSIS — H35373 Puckering of macula, bilateral: Secondary | ICD-10-CM | POA: Diagnosis not present

## 2024-08-27 DIAGNOSIS — H43393 Other vitreous opacities, bilateral: Secondary | ICD-10-CM | POA: Diagnosis not present

## 2024-08-27 DIAGNOSIS — H353221 Exudative age-related macular degeneration, left eye, with active choroidal neovascularization: Secondary | ICD-10-CM | POA: Diagnosis not present

## 2024-08-27 DIAGNOSIS — H43813 Vitreous degeneration, bilateral: Secondary | ICD-10-CM | POA: Diagnosis not present

## 2024-08-27 DIAGNOSIS — H353111 Nonexudative age-related macular degeneration, right eye, early dry stage: Secondary | ICD-10-CM | POA: Diagnosis not present

## 2024-09-19 DIAGNOSIS — D2261 Melanocytic nevi of right upper limb, including shoulder: Secondary | ICD-10-CM | POA: Diagnosis not present

## 2024-09-19 DIAGNOSIS — D225 Melanocytic nevi of trunk: Secondary | ICD-10-CM | POA: Diagnosis not present

## 2024-09-19 DIAGNOSIS — L570 Actinic keratosis: Secondary | ICD-10-CM | POA: Diagnosis not present

## 2024-09-19 DIAGNOSIS — C44519 Basal cell carcinoma of skin of other part of trunk: Secondary | ICD-10-CM | POA: Diagnosis not present

## 2024-09-19 DIAGNOSIS — D485 Neoplasm of uncertain behavior of skin: Secondary | ICD-10-CM | POA: Diagnosis not present

## 2024-09-19 DIAGNOSIS — D2262 Melanocytic nevi of left upper limb, including shoulder: Secondary | ICD-10-CM | POA: Diagnosis not present

## 2024-09-19 DIAGNOSIS — L821 Other seborrheic keratosis: Secondary | ICD-10-CM | POA: Diagnosis not present

## 2024-09-19 DIAGNOSIS — D2272 Melanocytic nevi of left lower limb, including hip: Secondary | ICD-10-CM | POA: Diagnosis not present

## 2024-09-19 DIAGNOSIS — D2271 Melanocytic nevi of right lower limb, including hip: Secondary | ICD-10-CM | POA: Diagnosis not present

## 2024-09-19 DIAGNOSIS — D044 Carcinoma in situ of skin of scalp and neck: Secondary | ICD-10-CM | POA: Diagnosis not present

## 2024-09-19 DIAGNOSIS — C44729 Squamous cell carcinoma of skin of left lower limb, including hip: Secondary | ICD-10-CM | POA: Diagnosis not present

## 2024-09-23 DIAGNOSIS — H353221 Exudative age-related macular degeneration, left eye, with active choroidal neovascularization: Secondary | ICD-10-CM | POA: Diagnosis not present

## 2024-10-22 DIAGNOSIS — H353111 Nonexudative age-related macular degeneration, right eye, early dry stage: Secondary | ICD-10-CM | POA: Diagnosis not present

## 2024-10-22 DIAGNOSIS — H35373 Puckering of macula, bilateral: Secondary | ICD-10-CM | POA: Diagnosis not present

## 2024-10-22 DIAGNOSIS — H43393 Other vitreous opacities, bilateral: Secondary | ICD-10-CM | POA: Diagnosis not present

## 2024-10-22 DIAGNOSIS — H43813 Vitreous degeneration, bilateral: Secondary | ICD-10-CM | POA: Diagnosis not present

## 2024-10-22 DIAGNOSIS — H353221 Exudative age-related macular degeneration, left eye, with active choroidal neovascularization: Secondary | ICD-10-CM | POA: Diagnosis not present
